# Patient Record
Sex: Female | Born: 1935 | Race: Black or African American | Hispanic: No | Smoking: Never smoker
Health system: Southern US, Community
[De-identification: ages and names within clinical notes are randomized; demographics above are authoritative.]

## PROBLEM LIST (undated history)

## (undated) DIAGNOSIS — F0280 Dementia in other diseases classified elsewhere without behavioral disturbance: Secondary | ICD-10-CM

## (undated) DIAGNOSIS — H409 Unspecified glaucoma: Secondary | ICD-10-CM

## (undated) DIAGNOSIS — M109 Gout, unspecified: Secondary | ICD-10-CM

## (undated) DIAGNOSIS — M810 Age-related osteoporosis without current pathological fracture: Secondary | ICD-10-CM

## (undated) DIAGNOSIS — R609 Edema, unspecified: Secondary | ICD-10-CM

## (undated) DIAGNOSIS — K219 Gastro-esophageal reflux disease without esophagitis: Secondary | ICD-10-CM

## (undated) DIAGNOSIS — E785 Hyperlipidemia, unspecified: Secondary | ICD-10-CM

## (undated) DIAGNOSIS — I10 Essential (primary) hypertension: Secondary | ICD-10-CM

## (undated) DIAGNOSIS — G309 Alzheimer's disease, unspecified: Secondary | ICD-10-CM

## (undated) DIAGNOSIS — E119 Type 2 diabetes mellitus without complications: Secondary | ICD-10-CM

## (undated) HISTORY — DX: Gout, unspecified: M10.9

## (undated) HISTORY — DX: Essential (primary) hypertension: I10

## (undated) HISTORY — DX: Age-related osteoporosis without current pathological fracture: M81.0

## (undated) HISTORY — DX: Edema, unspecified: R60.9

## (undated) HISTORY — DX: Dementia in other diseases classified elsewhere, unspecified severity, without behavioral disturbance, psychotic disturbance, mood disturbance, and anxiety: F02.80

## (undated) HISTORY — DX: Alzheimer's disease, unspecified: G30.9

## (undated) HISTORY — DX: Type 2 diabetes mellitus without complications: E11.9

## (undated) HISTORY — DX: Hyperlipidemia, unspecified: E78.5

---

## 2005-02-10 ENCOUNTER — Inpatient Hospital Stay (HOSPITAL_COMMUNITY): Admission: EM | Admit: 2005-02-10 | Discharge: 2005-02-18 | Payer: Self-pay | Admitting: Emergency Medicine

## 2005-02-28 ENCOUNTER — Emergency Department (HOSPITAL_COMMUNITY): Admission: EM | Admit: 2005-02-28 | Discharge: 2005-03-01 | Payer: Self-pay | Admitting: Emergency Medicine

## 2005-03-06 ENCOUNTER — Emergency Department (HOSPITAL_COMMUNITY): Admission: EM | Admit: 2005-03-06 | Discharge: 2005-03-06 | Payer: Self-pay | Admitting: Emergency Medicine

## 2005-03-08 ENCOUNTER — Inpatient Hospital Stay (HOSPITAL_COMMUNITY): Admission: EM | Admit: 2005-03-08 | Discharge: 2005-03-31 | Payer: Self-pay | Admitting: Emergency Medicine

## 2006-02-06 IMAGING — CR DG CHEST 2V
3 series · 3 of 3 positions shown · non-contrast
Comparison: 03/08/05.
COMPARISON: None.

CLINICAL DATA: Hypotension, diarrhea, abdominal and pelvic pain, dyspnea. 
 2-VIEW CHEST:
TECHNIQUE: Multidetector CT imaging of the abdomen was performed following the standard protocol with oral contrast but without IV contrast secondary to elevated creatinine.
TECHNIQUE: Multidetector CT imaging of the pelvis was performed following the standard protocol with oral contrast and without IV contrast. 
 Diffuse colonic wall thickening noted throughout the entire colon to the rectum.  Mild subcutaneous and mesenteric edema noted.  Tiny amount of free fluid in the pelvis noted.  Foley catheter, gas, and thick walled bladder noted.  The appendix is not identified.  There is no evidence of bowel obstruction.   Patient is status-post hysterectomy.

[view not recorded (1 of 3)]
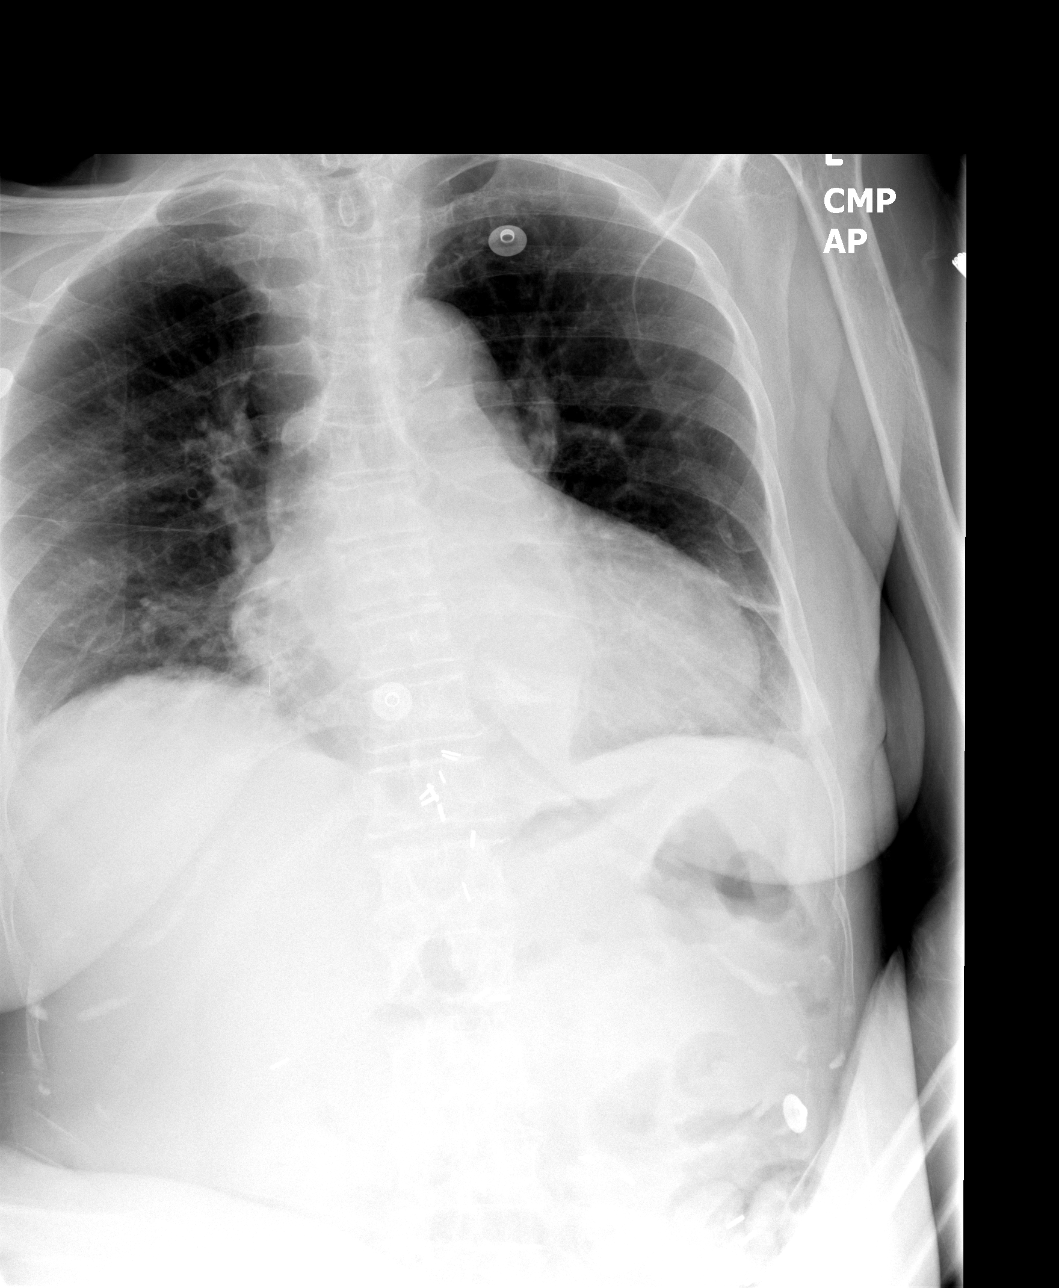

[view not recorded (2 of 3)]
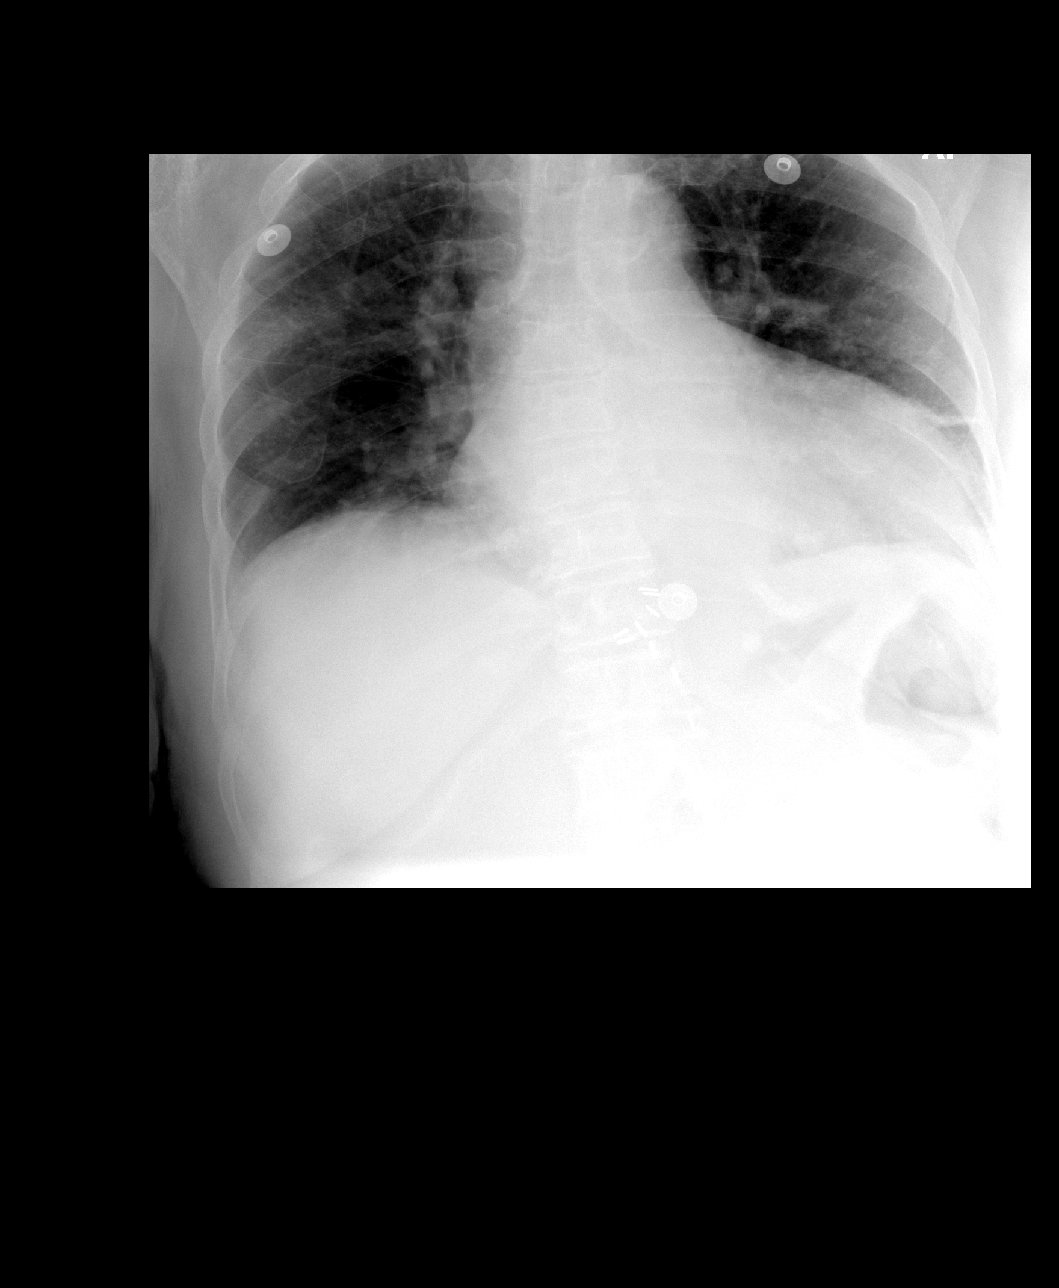

[view not recorded (3 of 3)]
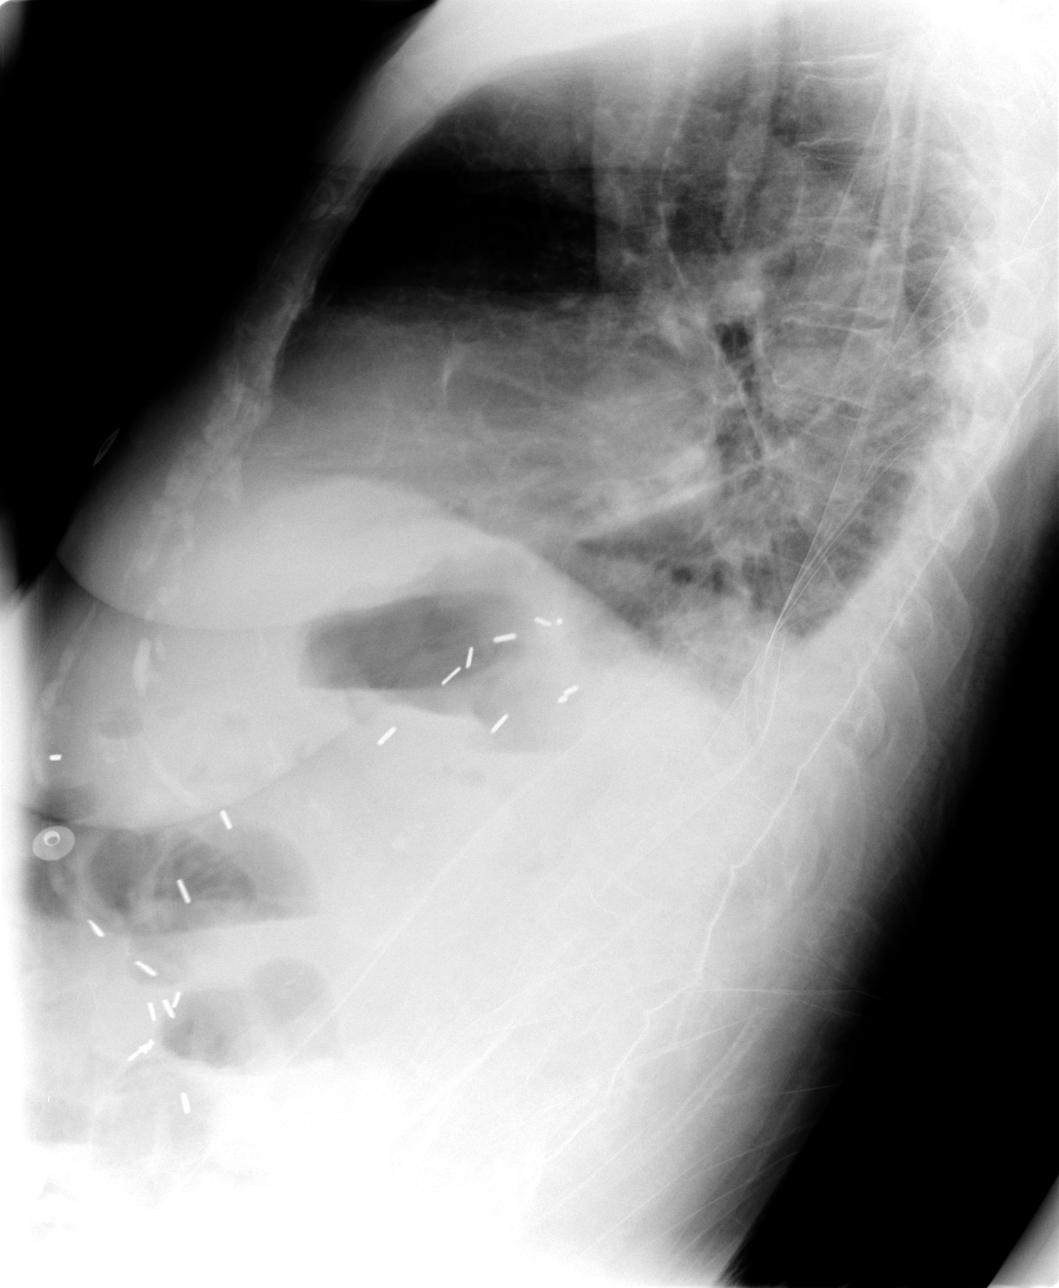

[3 of 3 positions shown; findings below may reference images not displayed]

Cardiomegaly, increasing pulmonary vascular congestion and small bilateral pleural effusions are noted.  Mild bibasilar atelectasis is identified.
IMPRESSION: Cardiomegaly with increasing vascular congestion and small effusions.  
 ABDOMEN CT WITHOUT CONTRAST:
Small bilateral pleural effusions and bibasilar atelectasis identified.  Cardiomegaly is present.  Surgical clips in the upper abdomen are present.  The liver, spleen, adrenal glands, visualized portions of the pancreas are unremarkable.  Mild renal atrophy and probable bilateral renal cysts are noted.  Probable small gallstone is noted but the remainder of the gallbladder is unremarkable.  There is wall thickening of the entire colon with mild adjacent inflammation compatible with colitis.  There is thickening of the terminal ileum at the ileocecal valve also noted.  The appendix is not identified.  Please note that parenchymal abnormalities within the solid viscera may be missed as intravenous contrast was not administered.  No evidence of abdominal aortic aneurysm or definite biliary dilatation.
IMPRESSION: 1.  Pancolitis probably infectious or inflammatory.  
 2.  Small bilateral pleural effusions, bibasilar atelectasis, and cardiomegaly. 
 3.  Mildly atrophic kidneys.  
 PELVIS CT WITHOUT CONTRAST:
IMPRESSION: 1.  Pancolitis likely infectious or inflammatory.
 2.  Tiny amount of free fluid.

## 2006-02-23 IMAGING — CR DG CHEST 1V PORT
1 series · 1 of 1 positions shown · non-contrast
Comparison: none

CLINICAL DATA: Hypotension

Portable chest at 0344:
Comparison 03/23/2005. New pleural effusions, right greater than left. Worsening
interstitial edema or infiltrate, throughout the right lung and at the left lung
base. Mild enlargement of cardiac silhouette stable. Vascular clips near the GE
junction.

[view not recorded]
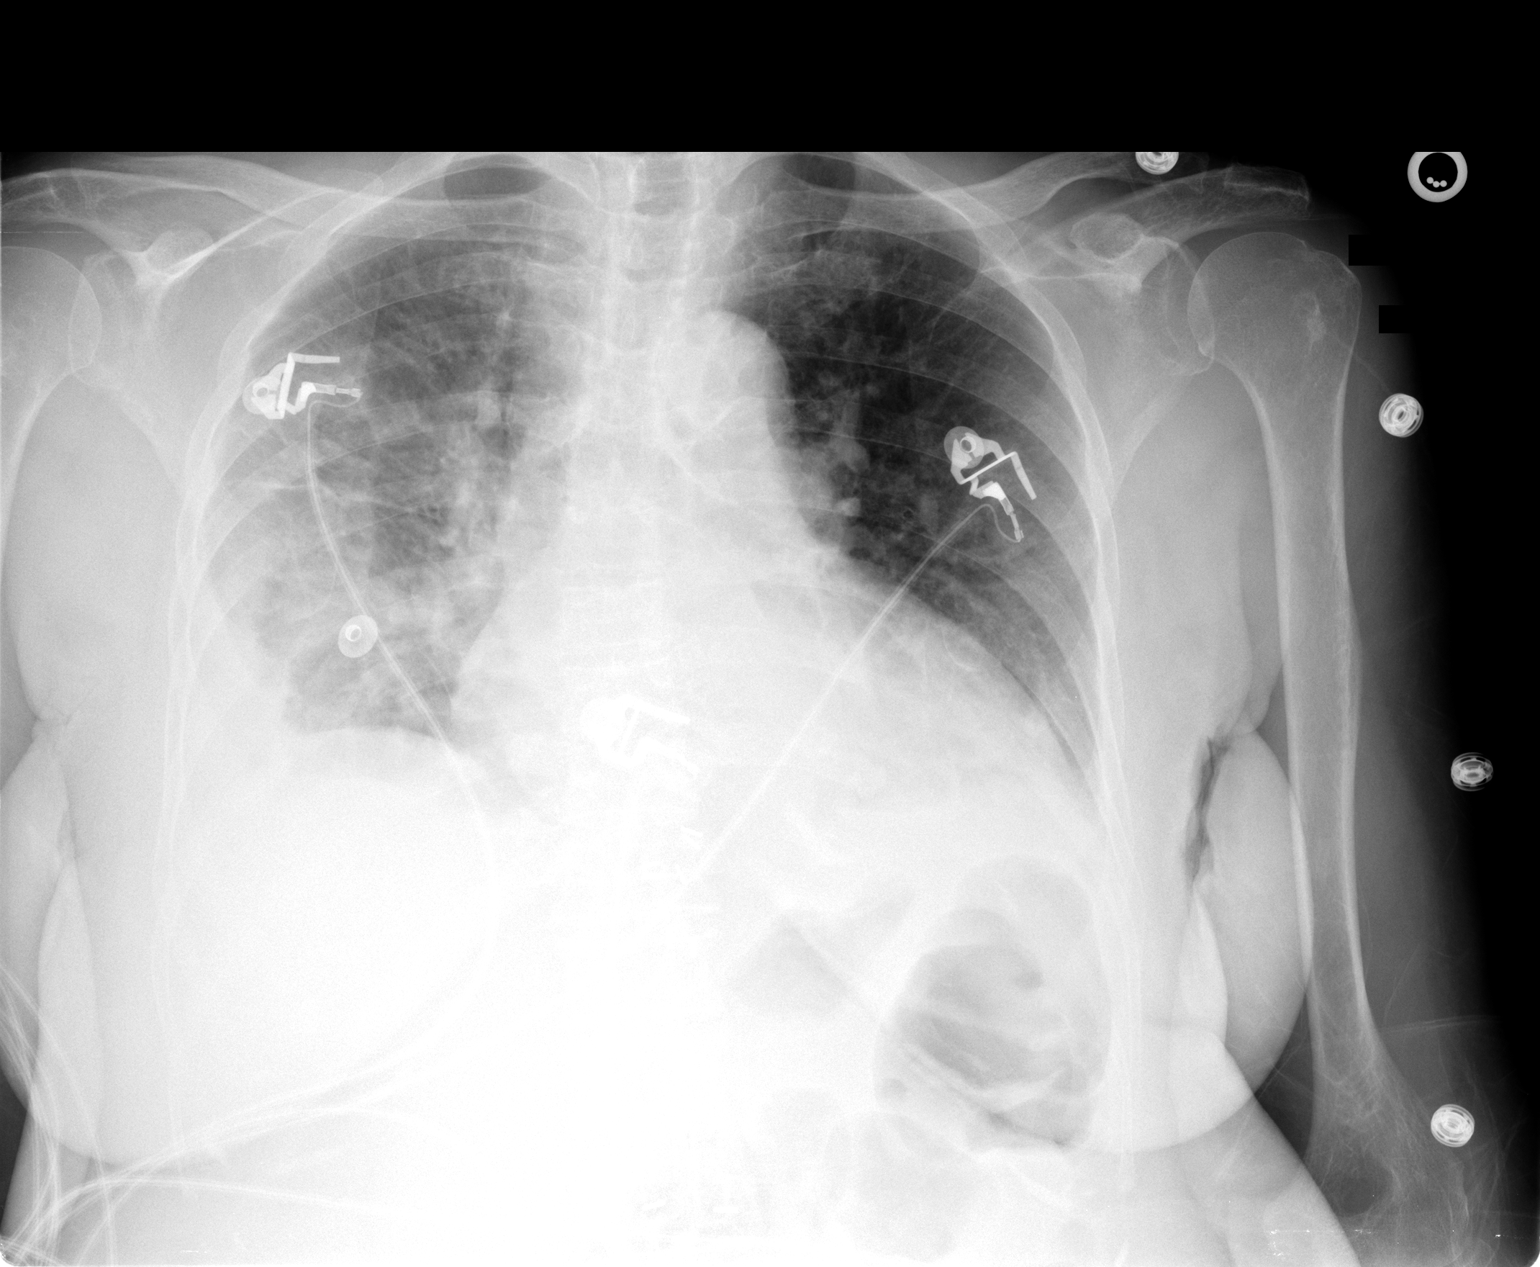

[1 of 1 positions shown; findings below may reference images not displayed]

IMPRESSION: 1. Worsening asymmetric edema or infiltrates effusions, right greater than left.
2. Stable cardiomegaly

## 2006-02-27 IMAGING — CR DG CHEST 2V
2 series · 2 of 2 positions shown · non-contrast
Comparison: 03/29/05.

CLINICAL DATA: Hypertension.  Diarrhea.  Weakness.
 CHEST - 2 VIEW:

[w chest lat]
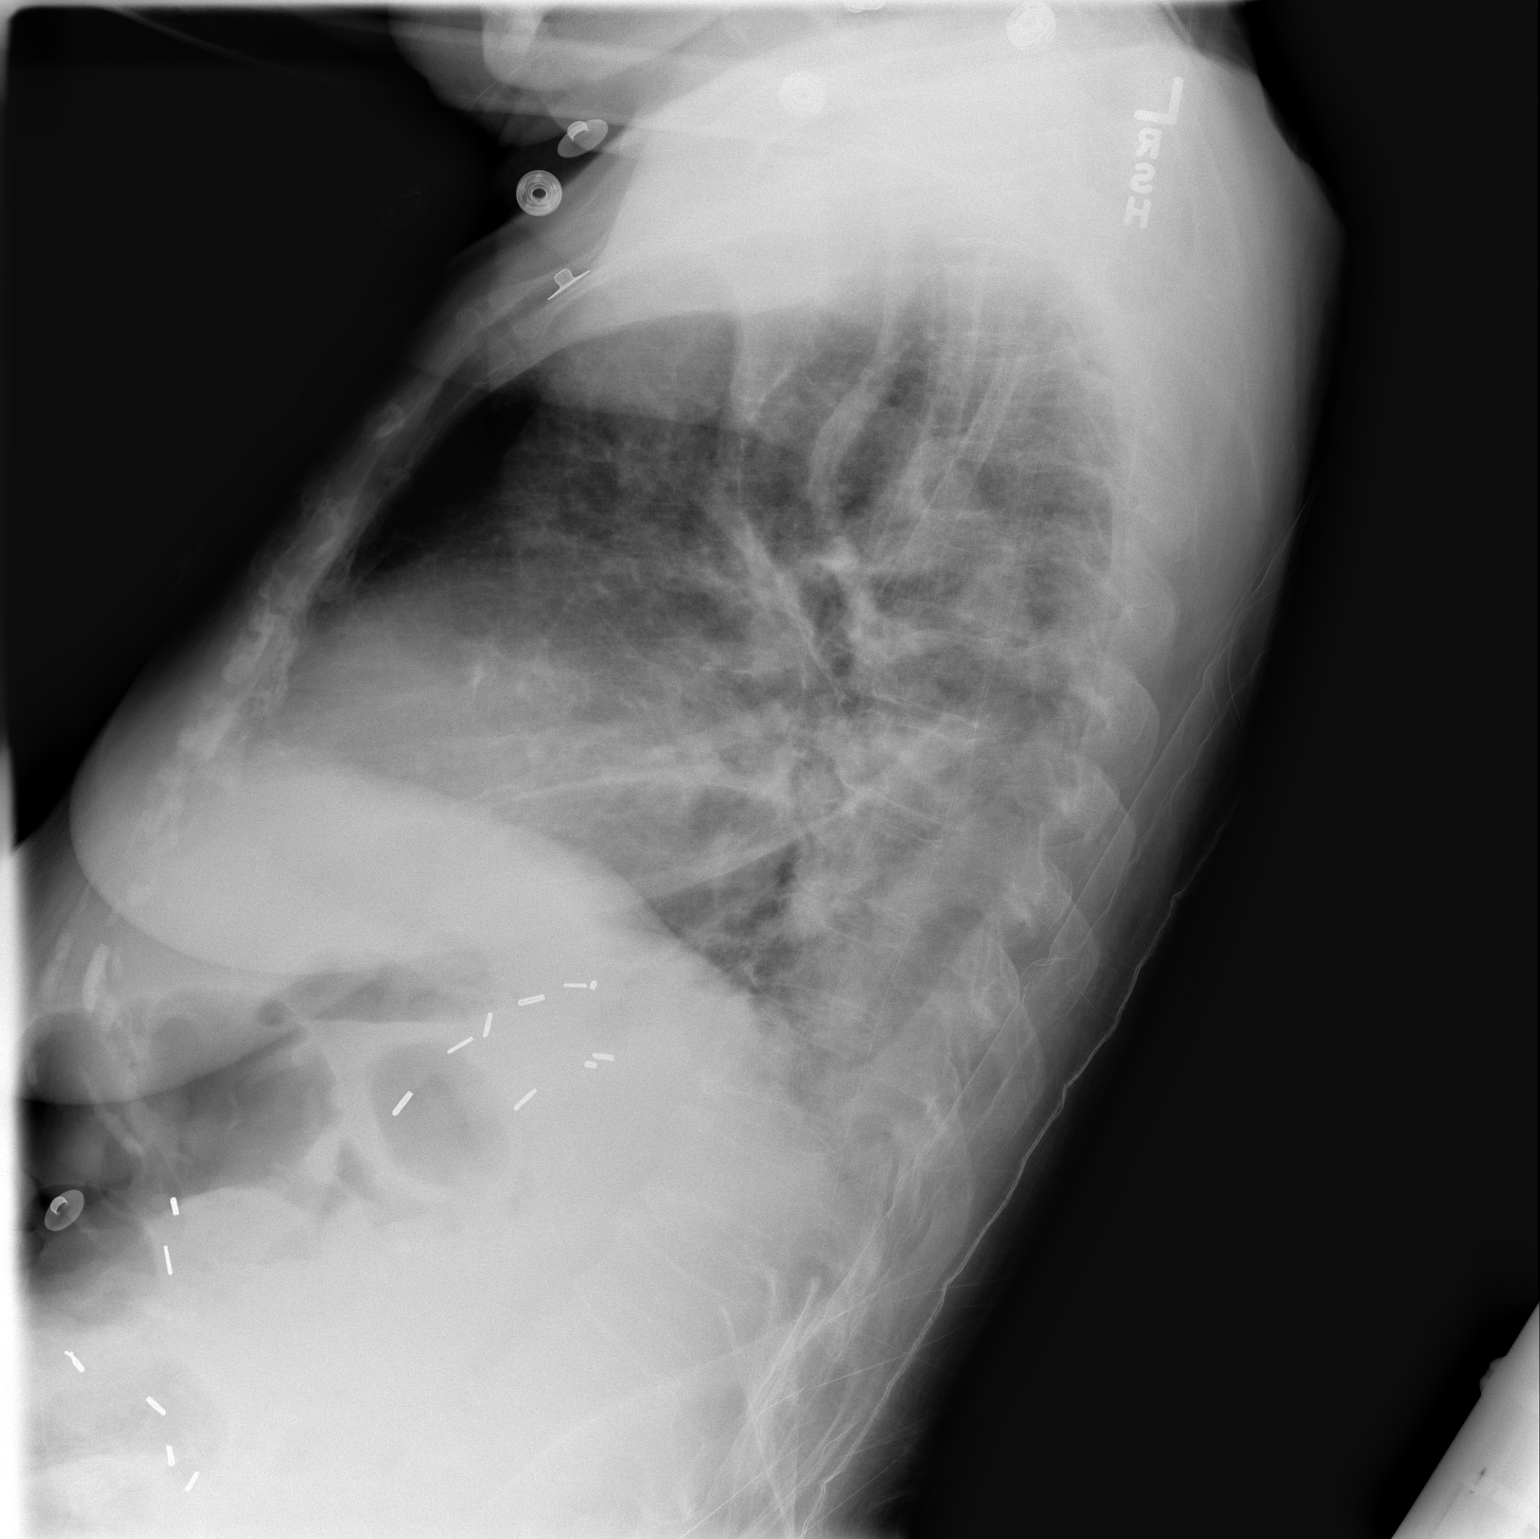

[w chest ap]
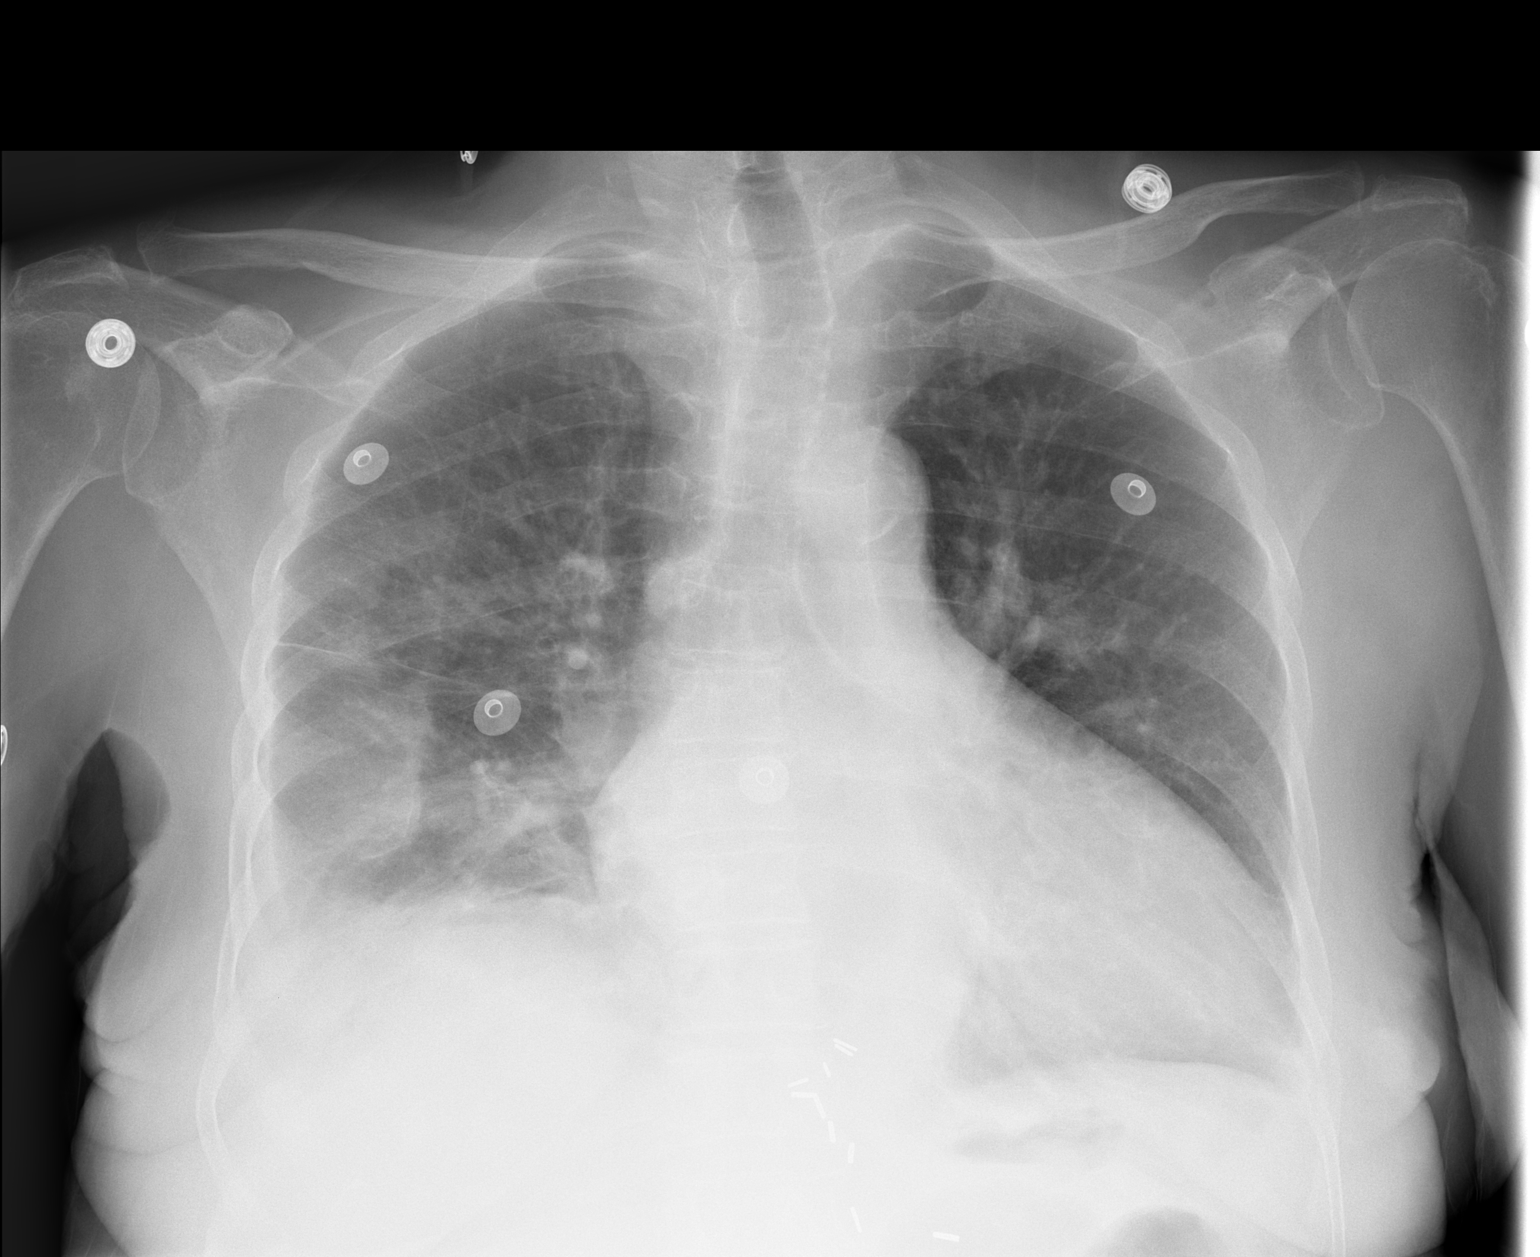

[2 of 2 positions shown; findings below may reference images not displayed]

FINDINGS: Cardiac enlargement is unchanged.  
 There is a right pleural effusion with increased opacity at the right lung base which may represent fluid tracking along the fissure. This is unchanged in appearance when compared to the prior examination.  Mild increased hazy opacity throughout the right lung may represent asymmetric pulmonary edema.  
 Left lung is clear.
IMPRESSION: 1.  No significant interval change in the appearance of the right lung. 
 2.  Stable cardiac enlargement.

## 2010-06-27 ENCOUNTER — Emergency Department (HOSPITAL_COMMUNITY)
Admission: EM | Admit: 2010-06-27 | Discharge: 2010-06-28 | Payer: Self-pay | Source: Home / Self Care | Admitting: Emergency Medicine

## 2010-12-04 NOTE — Consult Note (Signed)
[name], [name]             ACCOUNT NO.:  [id number]   MEDICAL RECORD NO.:  [id number]          PATIENT TYPE:  INP   LOCATION:  6742                         FACILITY:  [facility]   PHYSICIAN:  Deanna Artis. Hickling, M.D.DATE OF BIRTH:  Jan 29, 1913   DATE OF CONSULTATION:  03/11/2005  DATE OF DISCHARGE:                                   CONSULTATION   CHIEF COMPLAINT:  Altered mental status.   HISTORY OF THE PRESENT CONDITION:  Vickie [name] is a 77 year old woman  admitted with lethargy, hypotension, leg edema, abdominal discomfort,  confusion, and hypotension.   The patient was dehydrated.  She has been in the hospital since March 08, 2005.  She has had CT of her abdomen and pelvis which showed evidence of a  pancolitis on the basis of thickening of the wall of her colon.  GI consult  saw the patient and was unable to proceed with a colonoscopy because of her  somnolence.  I was asked to see her to provide an opinion as to the etiology  of her dysfunction.   REVIEW OF SYSTEMS:  Remarkable for a history of hypertension, previous mini  strokes, history of falling, bruising her right knee four days ago, and  falling in the bathroom two weeks ago, anemia, diarrhea, history of urinary  tract infection with urinary incontinence.   PAST MEDICAL/SURGICAL HISTORY:  1.  Left knee surgery 2-1/2 years ago.  2.  She also has problems with lower extremity edema.  3.  She also has had gastroesophageal reflux disease.  4.  Osteoporosis.  5.  Hypertension.  6.  Dyslipidemia.   During this hospitalization, she has had prerenal azotemia, with BUN 50,  creatinine 4.5, hyponatremia, sodium of 130, leukocytosis, with white cell  counts over 21,000, with moderate left shift, gastroparesis.   CURRENT MEDICATIONS:  1.  Protonix 40 mg daily.  2.  Subcutaneous heparin 5,000 units q.8 h.  3.  Allopurinol 100 mg daily (elevated uric acid).  4.  Albumin 12.5 g twice daily for hypoalbuminemia due  to malnutrition.  5.  She is not on an central nervous system-altering medications.   The patient has been incontinent of bladder, continent of bowel.  She  requires extensive assistance to ambulate, to transfer, to dress, to bathe,  and to toilet, and limited assistance with eating in the past.   MEDICATIONS PRIOR TO ADMISSION:  1.  Ciprofloxacin 250 mg twice daily.  2.  Prevacid 30 mg daily.  3.  Vytorin 10/20 daily.  4.  Clinoril 200 mg daily.  5.  Aricept 10 mg daily.  6.  Lisinopril 20 mg daily.  7.  Tylenol 325 mg q.6 h. as needed.   DRUG ALLERGIES:  None known.   EXAMINATION:  VITAL SIGNS:  Blood pressure 136/62; resting pulse 86;  temperature 100.9; respirations 20; oxygen saturation 96% on room air;  weight 135.2 pounds.  ENT:  Decreased range of motion of her neck, with spondylosis.  No  infections, no bruits.  LUNGS:  Clear.  HEART:  No murmurs.  PULSE:  Normal.  ABDOMEN:  Soft, but slightly  tender.  Bowel sounds normal.  No  hepatosplenomegaly.  EXTREMITIES:  Normal, except for surgical scars.  Slight edema distally.  NEUROLOGIC:  The patient is disoriented to all but her name, and that she is  in the hospital.  Her memory is poor.  She is able to name, repeat, and  follow commands (two step).  She is unable to write or read.  Cranial  nerves:  Pupils are 2 mm and reactive.  She has bilateral cataracts.  I  cannot see her fundi.  Visual fields are full to counting fingers.  Extraocular movements are full.  Symmetric facial strength.  Midline tongue.  She turns to localized sound.  Motor examination:  Normal strength in her  upper extremities.  Poor effort distally in her lower extremities.  She  wiggles and opposes her fingers.  She can wiggle her toes.  Sensation:  Withdrawal x4.  Cerebellar:  Finger-to-nose and rapid repetitive movements  are slow, but no dystaxia.  She does not have cogwheeling.  Gait cannot be  tested.  She is areflexic.  She had bilateral  flexor plantar responses.   IMPRESSION:  1.  Dementia by history, senile dementia of the Alzheimer's type, 331.0.  2.  Superimposed toxic metabolic delirium, 293.0.   I see no signs of focal neurologic deficits, despite the history of previous  mini strokes.  She has fever, leukocytosis which is improving, no signs of  urinary tract infection on the basis of urinalysis or cultures, negative  blood cultures at this time.  She has a mild metabolic acidosis.  She has  pancolitis.  She has severe malnutrition, with an albumin of 7.1, prerenal  azotemia, which is improving, and dysphagia.   RECOMMENDATIONS:  I agree with her medical management at this time.  The  patient seems to play possum.  Her eyes were wide open when I initially  saw her.  She tended to keep them closed, and I had to cajole her to get  anything done.  Though she responded slowly, she responded accurately to  most things.  As I left, she opened her eyes to the aide and gave a big  smile.  At this time, I see little to be gained from an extensive nervous  system workup including MRI scan and EEG with a nonlateralized examination.  I would have speech therapy perform a modified barium swallow.  The patient  should be on a D2 nectar-thick diet at this time.  I observed her having  some choking just on her saliva.  We probably should take her off of all  oral medications at this point and switch to IV medicines.  If she can  become more awake, then she needs to have a swallowing study.  Ultimately,  her long-term therapy goals need to be discussed with her family members as  regards Panda tube or feeding gastrostomy.  I would  consider the use of restarting her Aricept when her medical problems improve  and she can swallow.  We will check vitamin B-12, red blood cell folic acid,  VDRL, and TSH.   I appreciate the opportunity to participate in her care.      Deanna Artis. Sharene Skeans, M.D. Electronically Signed      [facility]/MEDQ  D:  03/11/2005  T:  03/12/2005  Job:  [id number]   cc:   Jarome Matin, M.D.  [address] [place]  [place] [address]  Fax: [phone]

## 2010-12-04 NOTE — Discharge Summary (Signed)
NAMEAMBER, WILLIARD NO.:  [id number]   MEDICAL RECORD NO.:  [id number]          PATIENT TYPE:  INP   LOCATION:  6742                         FACILITY:  [facility]   PHYSICIAN:  Jarome Matin, M.D.DATE OF BIRTH:  02-Dec-1912   DATE OF ADMISSION:  03/08/2005  DATE OF DISCHARGE:  03/31/2005                                 DISCHARGE SUMMARY   HISTORY OF PRESENT ILLNESS:  Vickie [name] is a 77 year old.  She was living  in an assisted living quarters at [facility] and she apparently had  developed a urinary tract infection and she had been put on Cipro.  She also  had had some diarrhea prior to that in the unit.  She had lost a lot of  fluid through the diarrhea and she had stopped eating, had become very weak  and became weaker and weaker over the last several days before admission.  Her blood pressure had dropped quite a bit and she had developed quite bit  of bilateral leg edema.  The patient is wheel-chair-bound and on admission,  apparently she had a blood pressure of about 70 systolic with a fairly rapid  heart beat to about 120, and the patient was not feeling well at all.  She  was somewhat confused and was admitted.   PHYSICAL EXAMINATION:  GENERAL:  She was arousable, but very lethargic and  she could not stand.  VITAL SIGNS:  As stated, her pulse was 62, respirations 18, and blood sugar  was 155 and her blood pressure was 80/40.  HEENT:  She was edentulous and had no teeth.  CHEST:  Chest was fairly clear.   HOSPITAL COURSE:  When the patient was admitted, she was very lethargic,  very edematous and confused.  I had had her in previous and she had been  discharged; she was alert and eating.  However, on this admission, she was  not eating at all and on this admission also, the pertinent labs, her white  count was up to 22,000 and when she was admitted before, her creatinine and  BUN were normal; her BUN was 51, creatinine was 4.5 on this  admission.  Earlier in the month, her BUN was 10 and creatinine was 1, so she has had  some dehydration and decrease in renal function, and she apparently has been  having a lot of diarrhea.  Uric acid was very high, about 14, and her  albumin was very low, down to 1.7.  We did ask GI to see the patient;  however, during her workup, because of the diarrhea and she had some blood  in her stool, I wanted to get a colonoscopy, but she was so weak that GI was  a little hesitant about doing a colonoscopy on her.  Also in her history,  she had some left knee surgery 2-1/2 years ago and she does have a history  of reflux esophagitis and reflux gastroparesis.  I also asked Neurology to  see her because of her mental status changes.  We did do a CT of her abdomen  and pelvis which showed pancolitis  and thickening of the wall of her colon.  She has had some mini-strokes in the past and a hypertensive history and  some falling.  On her stool, we did cultures and it came back positive for  C. difficile and so I spoke with GI and she decided we would go ahead and  treat her with Flagyl and she was not going to do the colonoscopy at this  point and wanted to see how she responded to treatment; she was put on  Flagyl 500 mg three times a day.  However, the patient was not eating at  all.  She had some elevations in the gamma area of her protein  electrophoresis; it seems like her proteins were a little bit elevated.  We  did a protein electrophoresis which showed a nonspecific pattern and so we  decided she did not have a monoclonal spike or anything of that nature.  We  continued to treat her.  She also developed a urinary tract infection which  was treated with Cipro.  She had some evidence early on of congestion and we  decided to go ahead and put her on some Lasix and to start her on BiDil one  tablet 3 times a day.  There was good evidence that she would likely respond  well to isosorbide dinitrate  and hydralazine; apparently there is some  increase in nitrous oxide on the myocardium in these situations.  The  patient's appetite was very poor, so we also started her on Megace 40 mg 3  times a day and then gradually over time, she started to eat, she started to  feel better, she was not complaining of shortness of breath and eventually  she was able to sit up in the chart.  Her white count, which had been up  very high on admission, 22,000, had come down to normal; her last white  count done on the 11th was 5.9 with a hemoglobin of 8.9 and hematocrit of  26.4; we gave her 2 units of packed cells.  Her BUN came down.  She had a  uric acid of 14 and we put her on allopurinol and fluids, and her BUN came  down to 9 with a creatinine of 1.0.  Albumin is still a little bit low at  2.6.  On the Megace, the patient's appetite began to develop, which improved  and she was eating most of her 3 meals a day and her labs began to improve  and normalize considerably.  We do not think she had a heart attack; she may  have had some high output failure.  Today, after being some time in the  hospital, she is sitting up in bed, she has much better labs, she is eating  quite well and has a good appetite.   DISCHARGE MEDICATIONS:  1.  We are going to continue her on Megace 40 mg 3 times a day.  2.  We are also going to continue her on allopurinol or Zyloprim 100 mg      every other day.  3.  We are going to discontinue the Flagyl for the C. difficile; she has      been on it over 2 weeks and we have had negative C. difficiles.  4.  Her Protonix -- we will continue that -- p.o. 40 mg daily.  5.  Aricept -- I had put her on 5 mg and we will up it to 10 mg daily.  6.  Also, we are going to continue some Lasix 20 mg daily p.o. and on that,      we will probably keep her on the potassium chloride 10 mEq twice a day      p.o.  7.  Catapres 0.1 mg p.o. b.i.d. 8.  As I said, we are going to continue on the  BiDil one tablet 3 times a      day p.o.  9.  We also put her on Lovenox because she does not move very well and I      think we should keep her on low-dose heparin and she can get that at the      nursing home, Lovenox 30 mg injection once daily.  10. I would like to give her some multivitamins; we can use Vicon Forte; she      can take one of those daily p.o.   DISCHARGE INSTRUCTIONS:  The patient had a PPD skin test put on and was not  put on until this morning, March 31, 2005 at 1 a.m. and needs to be read  in 48 hours and that will be Saturday, April 02, 2005 at 1 a.m., or  thereabouts.   DIETARY SUPPLEMENTATION:  She was given some Ensure with Fiber, vanilla it  seemed she liked, and she was getting that, a can twice a day as she wants  it; she seems to be taking it pretty well right now.   PAIN MANAGEMENT:  We also lucked out and gave her a couple of Tylenol for  pain as needed; she has not been complaining of very much pain lately.   ACTIVITY:  The patient is starting to do fairly well on physical therapy and  we would like to continue physical therapy at the nursing home and also OT  can address the possibility that she might benefit from OT and that would be  appreciated.           ______________________________  Jarome Matin, M.D.     CEF/MEDQ  D:  03/31/2005  T:  03/31/2005  Job:  [id number]

## 2010-12-04 NOTE — Discharge Summary (Signed)
Vickie, [name] NO.:  [id number]   MEDICAL RECORD NO.:  [id number]          PATIENT TYPE:  INP   LOCATION:  5735                         FACILITY:  [facility]   PHYSICIAN:  Jarome Matin, M.D.DATE OF BIRTH:  17-Oct-1912   DATE OF ADMISSION:  02/10/2005  DATE OF DISCHARGE:  02/18/2005                                 DISCHARGE SUMMARY   ADMISSION DIAGNOSIS:  Severe urinary tract infection with sepsis.   DISCHARGE DIAGNOSES:  1.  Resolving urinary tract infection and sepsis.  2.  Hypertension.  3.  Probable borderline diabetes.  4.  Gastroparesis.  5.  Arthritis.  6.  Early dementia that seems to be fairly well controlled with Aricept.   BRIEF HISTORY AND PHYSICAL/HOSPITAL COURSE:  Vickie [name] is a 77 year old  lovely lady who was brought to the emergency room from [facility] with  fever, lethargy, some mental status changes, and weakness. She was also  febrile. She had a temperature of 102.8, respirations 20, blood pressure  136/106. Chest was clear. She was very lethargic and she was admitted,  cultured, and cultures grew out E. coli (urine and blood). Her urine had  greater than 100,000 colonies sensitive to Cipro and she was started on IV  Cipro and then subsequently p.o. Cipro 250 mg t.i.d. Initial labs showed the  urine was nitrite positive, with many bacteria. Sodium was 136, potassium  3.8, glucose 175, BUN 28, creatinine 1.5. Glycosylated hemoglobin or  hemoglobin A1C of 6.6 __________ 7. The patient was not on diabetic  medications, but she thought that she might be borderline diabetic. White  count 4.9, RBC 3.1, hemoglobin 10.4, hematocrit 31.0.  Protein was a little  bit low at 2.2, but gradually came up some as her nutrition improved. The  patient was given fluids and treatment. Gradually she became afebrile, her  mental status seemed to clear, and she slowly began to feel better. She was  able to start eating and drinking and her  status cleared considerably. She  was able to talk and use the bedside commode on her own. We subsequently  took out the Foley. She continued to take some of her antibiotics for  another two days after she gets out. Cipro 250 mg p.o. b.i.d. We will  continue her other medications of Prevacid 30 mg daily, lisinopril 20 mg  daily, Vytorin 10/20 one p.o. daily, Clinoril 200 mg b.i.d., Aricept 10 mg  daily as well as some other medications that she was taking at the  [facility] unit that she can continue when she goes back. She will  take Lortab 2.5/500 p.r.n. for pain and that is after she has tried Tylenol.  I would like for the patient to continue with PT at the home on a regular  basis to help with walking and activities of daily living, keeping her  muscles from wasting, etc., keep her moving. We would like to put her on a  modified carbohydrate diet. I think she is a borderline diabetic and may not  need any medications at this point. We will just modify the carbohydrates in  her diet. Then we can see her in the office in two or three weeks after  discharge, bringing in a list of her medications and so forth.       CEF/MEDQ  D:  02/18/2005  T:  02/18/2005  Job:  [id number]

## 2012-11-14 ENCOUNTER — Non-Acute Institutional Stay (SKILLED_NURSING_FACILITY): Payer: Medicare Other | Admitting: Adult Health

## 2012-11-14 ENCOUNTER — Encounter: Payer: Self-pay | Admitting: Adult Health

## 2012-11-14 DIAGNOSIS — I10 Essential (primary) hypertension: Secondary | ICD-10-CM

## 2012-11-14 DIAGNOSIS — R609 Edema, unspecified: Secondary | ICD-10-CM

## 2012-11-14 DIAGNOSIS — M109 Gout, unspecified: Secondary | ICD-10-CM

## 2012-11-14 DIAGNOSIS — H409 Unspecified glaucoma: Secondary | ICD-10-CM

## 2012-11-14 DIAGNOSIS — F0280 Dementia in other diseases classified elsewhere without behavioral disturbance: Secondary | ICD-10-CM

## 2013-01-02 ENCOUNTER — Non-Acute Institutional Stay (SKILLED_NURSING_FACILITY): Payer: Medicare Other | Admitting: Adult Health

## 2013-01-02 DIAGNOSIS — R609 Edema, unspecified: Secondary | ICD-10-CM

## 2013-01-02 DIAGNOSIS — G309 Alzheimer's disease, unspecified: Secondary | ICD-10-CM

## 2013-01-02 DIAGNOSIS — F0280 Dementia in other diseases classified elsewhere without behavioral disturbance: Secondary | ICD-10-CM

## 2013-01-02 DIAGNOSIS — H409 Unspecified glaucoma: Secondary | ICD-10-CM

## 2013-01-02 DIAGNOSIS — M109 Gout, unspecified: Secondary | ICD-10-CM

## 2013-01-02 DIAGNOSIS — I10 Essential (primary) hypertension: Secondary | ICD-10-CM

## 2013-03-14 ENCOUNTER — Non-Acute Institutional Stay (SKILLED_NURSING_FACILITY): Payer: Medicare Other | Admitting: Internal Medicine

## 2013-03-14 DIAGNOSIS — M810 Age-related osteoporosis without current pathological fracture: Secondary | ICD-10-CM

## 2013-03-14 DIAGNOSIS — E119 Type 2 diabetes mellitus without complications: Secondary | ICD-10-CM

## 2013-03-14 DIAGNOSIS — F0280 Dementia in other diseases classified elsewhere without behavioral disturbance: Secondary | ICD-10-CM

## 2013-03-14 DIAGNOSIS — E785 Hyperlipidemia, unspecified: Secondary | ICD-10-CM

## 2013-03-14 DIAGNOSIS — R609 Edema, unspecified: Secondary | ICD-10-CM

## 2013-03-14 DIAGNOSIS — M109 Gout, unspecified: Secondary | ICD-10-CM

## 2013-03-14 DIAGNOSIS — I10 Essential (primary) hypertension: Secondary | ICD-10-CM

## 2013-03-14 NOTE — Progress Notes (Signed)
Patient ID: Vickie [name], female   DOB: 03/09/1913, 79 y.o.   MRN: [id number] Location:  Location:  [name] Provider:  [name]. Renato Gails, D.O., C.M.D.  Code Status:  DNR   Chief Complaint  Patient presents with  . Medical Managment of Chronic Issues  . Acute Visit    dry eyes despite eye drops    HPI: 79 yo black female long term care resident with h/o Alzheimer's disease, gout, senile osteoporosis was seen for medical mgt of her chronic diseases.  She has c/o dry eyes to nursing staff, but tells me that the drops they are giving her work just fine.  She had no complaints and was completing a word search in her room.     Review of Systems:  Review of Systems  Constitutional: Negative for fever and chills.  HENT: Negative for congestion.   Eyes: Negative for pain, discharge and redness.  Respiratory: Negative for shortness of breath.   Cardiovascular: Negative for chest pain.  Gastrointestinal: Negative for abdominal pain, diarrhea, constipation, blood in stool and melena.  Genitourinary: Negative for dysuria.  Musculoskeletal: Negative for myalgias and falls.  Skin: Negative for rash.  Neurological: Negative for dizziness and headaches.  Endo/Heme/Allergies: Does not bruise/bleed easily.    Medications: Patient's Medications  New Prescriptions   No medications on file  Previous Medications   ALLOPURINOL (ZYLOPRIM) 100 MG TABLET    Take 100 mg by mouth daily.   BIMATOPROST (LUMIGAN) 0.03 % OPHTHALMIC SOLUTION    Place 1 drop into both eyes at bedtime.   CALCITONIN, SALMON, (MIACALCIN/FORTICAL) 200 UNIT/ACT NASAL SPRAY    Place 1 spray into the nose daily.   CALCIUM-VITAMIN D (OSCAL WITH D) 500-200 MG-UNIT PER TABLET    Take 1 tablet by mouth 3 (three) times daily.   CARBOXYMETHYLCELLULOSE (REFRESH PLUS) 0.5 % SOLN    Place 1 drop into both eyes 4 (four) times daily.   DONEPEZIL (ARICEPT) 10 MG TABLET    Take 10 mg by mouth at bedtime.   FUROSEMIDE (LASIX) 40  MG TABLET    Take 40 mg by mouth daily.   MEMANTINE HCL ER (NAMENDA XR) 28 MG CP24    Take 28 mg by mouth daily.   MULTIPLE VITAMINS-IRON (MULTIVITAMINS WITH IRON) TABS TABLET    Take 1 tablet by mouth daily.   POTASSIUM CHLORIDE SA (K-DUR,KLOR-CON) 20 MEQ TABLET    Take 20 mEq by mouth daily.  Modified Medications   No medications on file  Discontinued Medications   No medications on file    Physical Exam: Filed Vitals:   03/14/13 1530  BP: 138/72  Pulse: 88  Temp: 98.6 F (37 C)  Resp: 20  SpO2: 97%   Physical Exam  Constitutional: She appears well-developed and well-nourished. No distress.  Seated in her wheelchair  HENT:  Head: Normocephalic and atraumatic.  Eyes: Conjunctivae and EOM are normal. Pupils are equal, round, and reactive to light. Right eye exhibits no discharge. Left eye exhibits no discharge. No scleral icterus.  Wears glasses for reading  Neck: Normal range of motion. Neck supple. No JVD present.  Cardiovascular: Normal rate, regular rhythm, normal heart sounds and intact distal pulses.   Pulmonary/Chest: Effort normal and breath sounds normal. No respiratory distress.  Abdominal: Soft. Bowel sounds are normal. She exhibits no distension and no mass. There is no tenderness.  Musculoskeletal: Normal range of motion. She exhibits no edema and no tenderness.  Lymphadenopathy:    She has no  cervical adenopathy.  Neurological: She is alert.  Oriented to person and place, not time  Skin: Skin is warm and dry.  Psychiatric: She has a normal mood and affect.   Labs reviewed: No recent (12/13)  Assessment/Plan 1. Diabetes mellitus type II, controlled -controlled with diet alone -check hba1c 2. Hyperlipidemia LDL goal < 100 -f/u flp 3. Gout -cont allopurinol, no recent flares 4. Alzheimer's disease -stable, staff have no concerns about her, not falling, attends activities and does word searches in her room, eating well -continue aricept and namenda--would  not stop due to her stability at this time 5. Hypertension, benign essential, goal below 140/90 -at goal with current therapy 6. Senile osteoporosis -on calcitonin for pain related to compression fractures -also on ca with D -f/u vitamin D level 7. Edema -no active edema with lasix and potassium -f/u cmp  Family/ staff Communication: discussed with nursing staff Goals of care: DNR Labs/tests ordered:  Cbc, cmp, flp, hba1c next draw

## 2013-03-19 ENCOUNTER — Encounter: Payer: Self-pay | Admitting: Internal Medicine

## 2013-03-19 DIAGNOSIS — I10 Essential (primary) hypertension: Secondary | ICD-10-CM | POA: Insufficient documentation

## 2013-03-19 DIAGNOSIS — M109 Gout, unspecified: Secondary | ICD-10-CM | POA: Insufficient documentation

## 2013-03-19 DIAGNOSIS — E785 Hyperlipidemia, unspecified: Secondary | ICD-10-CM | POA: Insufficient documentation

## 2013-03-19 DIAGNOSIS — F0280 Dementia in other diseases classified elsewhere without behavioral disturbance: Secondary | ICD-10-CM | POA: Insufficient documentation

## 2013-03-19 DIAGNOSIS — R609 Edema, unspecified: Secondary | ICD-10-CM | POA: Insufficient documentation

## 2013-03-19 DIAGNOSIS — M810 Age-related osteoporosis without current pathological fracture: Secondary | ICD-10-CM | POA: Insufficient documentation

## 2013-03-19 DIAGNOSIS — E1129 Type 2 diabetes mellitus with other diabetic kidney complication: Secondary | ICD-10-CM | POA: Insufficient documentation

## 2013-04-13 ENCOUNTER — Encounter: Payer: Self-pay | Admitting: Nurse Practitioner

## 2013-04-13 ENCOUNTER — Non-Acute Institutional Stay (SKILLED_NURSING_FACILITY): Payer: Medicare Other | Admitting: Nurse Practitioner

## 2013-04-13 DIAGNOSIS — M109 Gout, unspecified: Secondary | ICD-10-CM

## 2013-04-13 DIAGNOSIS — E119 Type 2 diabetes mellitus without complications: Secondary | ICD-10-CM

## 2013-04-13 DIAGNOSIS — I10 Essential (primary) hypertension: Secondary | ICD-10-CM

## 2013-04-13 DIAGNOSIS — R609 Edema, unspecified: Secondary | ICD-10-CM

## 2013-04-13 DIAGNOSIS — F0280 Dementia in other diseases classified elsewhere without behavioral disturbance: Secondary | ICD-10-CM

## 2013-04-13 NOTE — Progress Notes (Signed)
Patient ID: Vickie [name], female   DOB: 14-Nov-1912, 79 y.o.   MRN: [id number]   PCP: Bufford Spikes, DO   No Known Allergies  Chief Complaint  Patient presents with  . Medical Managment of Chronic Issues    HPI:  79 year old female who is a LTR of [facility] with PHM of DM, dementia, gout, edema, HTN is being seen today for routine follow up. Pt is unable to participate in ROS or HPI due to dementia; staff does not have any concerns at this time.   Review of Systems:  Unable to obtain  Past Medical History  Diagnosis Date  . Diabetes mellitus type II, controlled   . Hyperlipidemia LDL goal < 100   . Gout   . Alzheimer's disease   . Hypertension, benign essential, goal below 140/90   . Senile osteoporosis   . Edema    No past surgical history on file. Social History:   has no tobacco, alcohol, and drug history on file.  No family history on file.  Medications: Patient's Medications  New Prescriptions   No medications on file  Previous Medications   ALLOPURINOL (ZYLOPRIM) 100 MG TABLET    Take 100 mg by mouth daily.   BIMATOPROST (LUMIGAN) 0.03 % OPHTHALMIC SOLUTION    Place 1 drop into both eyes at bedtime.   CALCITONIN, SALMON, (MIACALCIN/FORTICAL) 200 UNIT/ACT NASAL SPRAY    Place 1 spray into the nose daily.   CALCIUM-VITAMIN D (OSCAL WITH D) 500-200 MG-UNIT PER TABLET    Take 1 tablet by mouth 3 (three) times daily.   CARBOXYMETHYLCELLULOSE (REFRESH PLUS) 0.5 % SOLN    Place 1 drop into both eyes 4 (four) times daily.   DONEPEZIL (ARICEPT) 10 MG TABLET    Take 10 mg by mouth at bedtime.   FUROSEMIDE (LASIX) 40 MG TABLET    Take 40 mg by mouth daily.   MEMANTINE HCL ER (NAMENDA XR) 28 MG CP24    Take 28 mg by mouth daily.   MULTIPLE VITAMINS-IRON (MULTIVITAMINS WITH IRON) TABS TABLET    Take 1 tablet by mouth daily.   POTASSIUM CHLORIDE SA (K-DUR,KLOR-CON) 20 MEQ TABLET    Take 20 mEq by mouth daily.  Modified Medications   No medications on file  Discontinued  Medications   No medications on file     Physical Exam:  Filed Vitals:   04/13/13 1734  BP: 137/73  Pulse: 71  Temp: 98 F (36.7 C)  Resp: 20  Weight: 147 lb (66.679 kg)   Physical Exam  Constitutional: She is well-developed, well-nourished, and in no distress. No distress.  HENT:  Head: Normocephalic and atraumatic.  Mouth/Throat: Oropharynx is clear and moist. No oropharyngeal exudate.  Eyes: Conjunctivae and EOM are normal. Pupils are equal, round, and reactive to light.  Neck: Normal range of motion. Neck supple.  Cardiovascular: Normal rate, regular rhythm and normal heart sounds.   Pulmonary/Chest: Effort normal and breath sounds normal.  Abdominal: Soft. Bowel sounds are normal. She exhibits no distension.  Musculoskeletal: She exhibits no edema and no tenderness.  Self propels in [facility]  Neurological: She is alert.  Skin: Skin is warm and dry. She is not diaphoretic.     Assessment/Plan 1. Hypertension, benign essential, goal below 140/90 Patient is stable; continue current regimen. Will monitor and make changes as necessary.  2. Diabetes mellitus type II, controlled Not currently on medications  3. Alzheimer's disease Stable in current setting  4. Gout Stable on allopurinol  5. Edema No noted edema; will dc potassium and lasix and recheck labs Staff to monitor for worsening edema while off medications

## 2013-05-11 ENCOUNTER — Non-Acute Institutional Stay (SKILLED_NURSING_FACILITY): Payer: Medicare Other | Admitting: Internal Medicine

## 2013-05-11 ENCOUNTER — Encounter: Payer: Self-pay | Admitting: Internal Medicine

## 2013-05-11 DIAGNOSIS — I10 Essential (primary) hypertension: Secondary | ICD-10-CM

## 2013-05-11 NOTE — Progress Notes (Signed)
MRN: [id number] Name: Vickie [name]  Sex: female Age: 79 y.o. DOB: 1913-07-19  PSC #: Ronni Rumble Facility/Room: 233A Level Of Care: SNF Provider: Merrilee Seashore D Emergency Contacts: Extended Emergency Contact Information Primary Emergency Contact: Smith,Roslyn Address: [address] [place], [place] [address] [place] Home Phone: [phone] Relation: Other  Code Status: DNR  Allergies: Review of patient's allergies indicates no known allergies.  Chief Complaint  Patient presents with  . Acute Visit    HPI: Patient is 80 y.o. female who the nurse asked me to see because of pedal edema , new for pt. Admits pt is not on BP meds.  Past Medical History  Diagnosis Date  . Diabetes mellitus type II, controlled   . Hyperlipidemia LDL goal < 100   . Gout   . Alzheimer's disease   . Hypertension, benign essential, goal below 140/90   . Senile osteoporosis   . Edema     History reviewed. No pertinent past surgical history.    Medication List       This list is accurate as of: 05/11/13  4:19 PM.  Always use your most recent med list.               allopurinol 100 MG tablet  Commonly known as:  ZYLOPRIM  Take 100 mg by mouth daily.     bimatoprost 0.03 % ophthalmic solution  Commonly known as:  LUMIGAN  Place 1 drop into both eyes at bedtime.     calcitonin (salmon) 200 UNIT/ACT nasal spray  Commonly known as:  MIACALCIN/FORTICAL  Place 1 spray into the nose daily.     calcium-vitamin D 500-200 MG-UNIT per tablet  Commonly known as:  OSCAL WITH D  Take 1 tablet by mouth 3 (three) times daily.     carboxymethylcellulose 0.5 % Soln  Commonly known as:  REFRESH PLUS  Place 1 drop into both eyes 4 (four) times daily.     donepezil 10 MG tablet  Commonly known as:  ARICEPT  Take 10 mg by mouth at bedtime.     multivitamins with iron Tabs tablet  Take 1 tablet by mouth daily.     NAMENDA XR 28 MG Cp24  Generic drug:   Memantine HCl ER  Take 28 mg by mouth daily.        No orders of the defined types were placed in this encounter.     There is no immunization history on file for this patient.  History  Substance Use Topics  . Smoking status: Unknown If Ever Smoked  . Smokeless tobacco: Not on file  . Alcohol Use: No    Review of Systems  DATA OBTAINED: from patient, nurse, GENERAL: Feels well no fevers, fatigue, appetite changes SKIN: No itching, rash HEENT: No complaint RESPIRATORY: No cough, wheezing, SOB CARDIAC: No chest pain, palpitations, + extremity edema  GI: No abdominal pain, No N/V/D or constipation, No heartburn or reflux  GU: No dysuria, frequency or urgency, or incontinence  MUSCULOSKELETAL: No unrelieved bone/joint pain NEUROLOGIC: No headache, dizziness or focal weakness PSYCHIATRIC: No overt anxiety or sadness. Sleeps well.   Filed Vitals:   05/11/13 1537  BP: 170/98  Pulse: 89  Temp: 98.5 F (36.9 C)  Resp: 20    Physical Exam  GENERAL APPEARANCE: Alert, conversant. Appropriately groomed. No acute distress  SKIN: No diaphoresis rash, or wounds HEENT: Unremarkable RESPIRATORY: Breathing is even, unlabored. Lung sounds  are clear   CARDIOVASCULAR: Heart RRR no murmurs, rubs or gallops. 1+ peripheral edema  GASTROINTESTINAL: Abdomen is soft, non-tender, not distended w/ normal bowel sounds.  GENITOURINARY: Bladder non tender, not distended  MUSCULOSKELETAL: No abnormal joints or musculature NEUROLOGIC: Cranial nerves 2-12 grossly intact. Moves all extremities no tremor. PSYCHIATRIC: Mood and affect appropriate to situation, no behavioral issues  Patient Active Problem List   Diagnosis Date Noted  . Diabetes mellitus type II, controlled   . Hyperlipidemia LDL goal < 100   . Gout   . Alzheimer's disease   . Hypertension, benign essential, goal below 140/90   . Senile osteoporosis   . Edema     CBC 9/292014  WBC 5.1  9.6/28.9  PLT 234   CMP 04/16/2013   142, 4.5, 106, 29, 96  24/1.78   Alb  3.1   Assessment and Plan  Hypertension, benign essential, goal below 140/90 Pt has 1+ pitting edema of feet and legs; SBP is all over the place -123 up 170, no apparent pattern; Pt has CRI  Cr 1.78; REC #1 lasix 20 mg q am-may need to go to 40 mg because of renal function, but will have to watch renal function and # 2 TED hose    Margit Hanks, MD

## 2013-05-11 NOTE — Assessment & Plan Note (Signed)
Pt has 1+ pitting edema of feet and legs; SBP is all over the place -123 up 170, no apparent pattern; Pt has CRI  Cr 1.78; REC #1 lasix 20 mg q am-may need to go to 40 mg because of renal function, but will have to watch renal function and # 2 TED hose

## 2013-05-23 NOTE — Progress Notes (Signed)
Patient ID: Vickie [name], female   DOB: June 23, 1913, 79 y.o.   MRN: [id number]  [facility]  No Known Allergies  Chief Complaint  Patient presents with  . Medical Managment of Chronic Issues    HPI  She is being seen for the management of her chronic illnesses.  There are no concerns being voiced by the nursing staff at this time. Overall her status remains without change. She is unable to participate in the hpi or ros.   Past Medical History  Diagnosis Date  . Diabetes mellitus type II, controlled   . Hyperlipidemia LDL goal < 100   . Gout   . Alzheimer's disease   . Hypertension, benign essential, goal below 140/90   . Senile osteoporosis   . Edema     No past surgical history on file.  Filed Vitals:   11/14/12 2343  BP: 128/74  Pulse: 70  Height: 5\' 1"  (1.549 m)  Weight: 147 lb (66.679 kg)    MEDICATIONS  Allopurinol 100 mg daily aricept 10 mg nightly Ca++ 100/215 mg tid Calcitonin daily Lasix 40 mg daily lumingan to both eyes nightly mvi with iron daily namenda 10 mg twice daily K+ 20 meq daily   LABS REVIEWED:  None recent   Review of Systems  Unable to perform ROS   Physical Exam  Constitutional: She appears well-developed and well-nourished.  Neck: Neck supple. No JVD present.  Cardiovascular: Normal rate, regular rhythm and intact distal pulses.   Respiratory: Effort normal and breath sounds normal. No respiratory distress. She has no wheezes.  GI: Soft. Bowel sounds are normal. She exhibits no distension. There is no tenderness.  Musculoskeletal: She exhibits no edema.  Is able to move all extremities   Skin: Skin is warm and dry.      ASSESSMENT/PLAN  1. Hypertension is stable; is not on medications; due to her advanced age it is highly unlikely that she will need medications for this condition. Will continue to monitor  2. Gout: will continue allopurinol 100 mg daily there are have no recent flares  3. Alzheimer's disease: without  significant change in status; will continue aricept 10 mg nightly and namenda 10 mg twice daily  4. Edema: stable will continue lasix 40 mg daily with k+ 20 meq daily  5. Glaucoma: will continue lumingan to both eyes nightly

## 2013-05-25 ENCOUNTER — Non-Acute Institutional Stay (SKILLED_NURSING_FACILITY): Payer: Medicare Other | Admitting: Internal Medicine

## 2013-05-25 DIAGNOSIS — E785 Hyperlipidemia, unspecified: Secondary | ICD-10-CM

## 2013-05-25 NOTE — Progress Notes (Signed)
Patient ID: Vickie [name], female   DOB: 04/22/1913, 79 y.o.   MRN: [id number] Location:  [name] Provider:  [name]. Renato Gails, D.O., C.M.D.  Code Status:  DNR   Chief Complaint  Patient presents with  . Acute Visit    cholesterol elevated    HPI:  79 yo female here for long term care--has vascular dementia, remains quite functional, cholesterol is grossly abnormal.  Review of Systems:  Review of Systems  Unable to perform ROS: dementia    Medications: Patient's Medications  New Prescriptions   No medications on file  Previous Medications   ALLOPURINOL (ZYLOPRIM) 100 MG TABLET    Take 100 mg by mouth daily.   BIMATOPROST (LUMIGAN) 0.03 % OPHTHALMIC SOLUTION    Place 1 drop into both eyes at bedtime.   CALCITONIN, SALMON, (MIACALCIN/FORTICAL) 200 UNIT/ACT NASAL SPRAY    Place 1 spray into the nose daily.   CALCIUM-VITAMIN D (OSCAL WITH D) 500-200 MG-UNIT PER TABLET    Take 1 tablet by mouth 3 (three) times daily.   CARBOXYMETHYLCELLULOSE (REFRESH PLUS) 0.5 % SOLN    Place 1 drop into both eyes 4 (four) times daily.   DONEPEZIL (ARICEPT) 10 MG TABLET    Take 10 mg by mouth at bedtime.   MEMANTINE HCL ER (NAMENDA XR) 28 MG CP24    Take 28 mg by mouth daily.   MULTIPLE VITAMINS-IRON (MULTIVITAMINS WITH IRON) TABS TABLET    Take 1 tablet by mouth daily.  Modified Medications   No medications on file  Discontinued Medications   No medications on file    Physical Exam: Physical Exam  Vitals reviewed. Cardiovascular: Normal rate, regular rhythm, normal heart sounds and intact distal pulses.   Pulmonary/Chest: Effort normal and breath sounds normal. No respiratory distress.  Abdominal: Soft. Bowel sounds are normal. She exhibits no distension and no mass. There is no tenderness.  Neurological: She is alert.  Skin: Skin is warm and dry.     Labs reviewed: 05/25/13:  BUN 17, cr 1.29 (improved), Na 142, K 4.4; hba1c 6.9; tc 224, TG 170, LDL 146, HDL  44  Assessment/Plan 1. Hyperlipidemia LDL goal < 100 -begin pravachol 20mg  qhs and recheck flp in 6 wks due to gross elevation of ldl and her diabetes (CAD equivalent)  Labs/tests ordered:  F/u flp in 6 wks

## 2013-05-29 NOTE — Progress Notes (Signed)
Patient ID: Vickie [name], female   DOB: Feb 11, 1913, 79 y.o.   MRN: [id number]  [facility]  No Known Allergies Chief Complaint  Patient presents with  . Medical Managment of Chronic Issues    HPI  She is being seen today for the management of her chronic illnesses.  She is unable to fully participate in the hpi or ros. There are no concerns being voiced by the nursing staff at this time. There is no recent change in her overall status.   Past Medical History  Diagnosis Date  . Diabetes mellitus type II, controlled   . Hyperlipidemia LDL goal < 100   . Gout   . Alzheimer's disease   . Hypertension, benign essential, goal below 140/90   . Senile osteoporosis   . Edema     No past surgical history on file.      MEDICATIONS  Allopurinol 100 mg daily aricept 10 mg nightly Ca++ 100/215 mg tid Calcitonin daily Lasix 40 mg daily lumingan to both eyes nightly mvi with iron daily namenda 10 mg twice daily K+ 20 meq daily   LABS REVIEWED:  None recent   Review of Systems  Unable to perform ROS   Physical Exam  Constitutional: She appears well-developed and well-nourished.  Neck: Neck supple. No JVD present.  Cardiovascular: Normal rate, regular rhythm and intact distal pulses.   Respiratory: Effort normal and breath sounds normal. No respiratory distress. She has no wheezes.  GI: Soft. Bowel sounds are normal. She exhibits no distension. There is no tenderness.  Musculoskeletal: She exhibits no edema.  Is able to move all extremities   Skin: Skin is warm and dry.      ASSESSMENT/PLAN  1. Hypertension is stable; is not on medications; due to her advanced age it is highly unlikely that she will need medications for this condition. Will continue to monitor  2. Gout: will continue allopurinol 100 mg daily there are have no recent flares  3. Alzheimer's disease: without significant change in status; will continue aricept 10 mg nightly will change her namenda to  xr 28 mg daily   4. Edema: stable will continue lasix 40 mg daily with k+ 20 meq daily  5. Glaucoma: will continue lumingan to both eyes nightly   Will check cbc; cmp; hgb a1c next draw.

## 2013-06-04 ENCOUNTER — Non-Acute Institutional Stay (SKILLED_NURSING_FACILITY): Payer: Medicare Other | Admitting: Nurse Practitioner

## 2013-06-04 DIAGNOSIS — L918 Other hypertrophic disorders of the skin: Secondary | ICD-10-CM

## 2013-06-04 DIAGNOSIS — L909 Atrophic disorder of skin, unspecified: Secondary | ICD-10-CM

## 2013-06-04 NOTE — Progress Notes (Signed)
Patient ID: Vickie [name], female   DOB: 1913/02/22, 79 y.o.   MRN: [id number] Nursing Home Location:  [facility]   Place of Service: SNF (31)  PCP: REED, TIFFANY, DO   No Known Allergies  Chief Complaint  Patient presents with  . Acute Visit    HPI:  79 year old female who is a LTR of [facility] with PHM of DM, dementia, gout, edema, HTN is being seen today at request of nursing; pts niece reported to staff she had some bleeding on her skin. Treatment nurse reported skin tag and now I have been asked to look at this; pt with advanced dementia and reports it occasionally bothers her.   Review of Systems:  Unable to obtain  Past Medical History  Diagnosis Date  . Diabetes mellitus type II, controlled   . Hyperlipidemia LDL goal < 100   . Gout   . Alzheimer's disease   . Hypertension, benign essential, goal below 140/90   . Senile osteoporosis   . Edema    No past surgical history on file. Social History:   reports that she does not drink alcohol or use illicit drugs. Her tobacco history is not on file.  No family history on file.  Medications: Patient's Medications  New Prescriptions   No medications on file  Previous Medications   ALLOPURINOL (ZYLOPRIM) 100 MG TABLET    Take 100 mg by mouth daily.   BIMATOPROST (LUMIGAN) 0.03 % OPHTHALMIC SOLUTION    Place 1 drop into both eyes at bedtime.   CALCITONIN, SALMON, (MIACALCIN/FORTICAL) 200 UNIT/ACT NASAL SPRAY    Place 1 spray into the nose daily.   CALCIUM-VITAMIN D (OSCAL WITH D) 500-200 MG-UNIT PER TABLET    Take 1 tablet by mouth 3 (three) times daily.   CARBOXYMETHYLCELLULOSE (REFRESH PLUS) 0.5 % SOLN    Place 1 drop into both eyes 4 (four) times daily.   DONEPEZIL (ARICEPT) 10 MG TABLET    Take 10 mg by mouth at bedtime.   LISINOPRIL (PRINIVIL,ZESTRIL) 2.5 MG TABLET    Take 2.5 mg by mouth daily.   MEMANTINE HCL ER (NAMENDA XR) 28 MG CP24    Take 28 mg by mouth daily.   MULTIPLE VITAMINS-IRON  (MULTIVITAMINS WITH IRON) TABS TABLET    Take 1 tablet by mouth daily.   PRAVASTATIN (PRAVACHOL) 20 MG TABLET    Take 20 mg by mouth daily.  Modified Medications   No medications on file  Discontinued Medications   No medications on file     Physical Exam:  Filed Vitals:   06/04/13 1509  BP: 149/86  Pulse: 59  Temp: 98.3 F (36.8 C)  Resp: 20  SpO2: 94%    Physical Exam  Constitutional: She is well-developed, well-nourished, and in no distress. No distress.  Cardiovascular: Normal rate, regular rhythm and normal heart sounds.   Pulmonary/Chest: Effort normal and breath sounds normal. No respiratory distress.  Abdominal: Soft. Bowel sounds are normal. She exhibits no distension. There is no tenderness.  Skin: Skin is warm and dry. She is not diaphoretic. No erythema.  0.8 cm skin tag that is round; old blood noted with scab to area; no active bleeding    Assessment/Plan 1. Skin tag Will have pt sent to dermatology to have tag removed  Staff to monitor area and cover if area starts to bleed  2. Follow up bmp and cbc

## 2013-06-10 ENCOUNTER — Encounter: Payer: Self-pay | Admitting: Internal Medicine

## 2013-06-10 ENCOUNTER — Non-Acute Institutional Stay (SKILLED_NURSING_FACILITY): Payer: Medicare Other | Admitting: Internal Medicine

## 2013-06-10 DIAGNOSIS — I10 Essential (primary) hypertension: Secondary | ICD-10-CM

## 2013-06-10 DIAGNOSIS — D631 Anemia in chronic kidney disease: Secondary | ICD-10-CM | POA: Insufficient documentation

## 2013-06-10 DIAGNOSIS — N183 Chronic kidney disease, stage 3 unspecified: Secondary | ICD-10-CM | POA: Insufficient documentation

## 2013-06-10 DIAGNOSIS — N189 Chronic kidney disease, unspecified: Secondary | ICD-10-CM

## 2013-06-10 NOTE — Progress Notes (Signed)
This encounter was created in error - please disregard.

## 2013-06-10 NOTE — Assessment & Plan Note (Signed)
Pt' pedal edema is gone-she is wearing TED hose; her BP is high today -will increase Lisinopril to 5 mg daily

## 2013-06-10 NOTE — Assessment & Plan Note (Addendum)
11/20  10.2/32.9,   Improved from  9/29  9.6/28.9   MCV  82.3; pt is not on iron, will check iron levels

## 2013-06-10 NOTE — Assessment & Plan Note (Addendum)
BUN/Cr 11/20 19/1.39; 11/7  Cr 1.29   10/24  Cr 1.78;  GFR  37.5; will continue surveillance

## 2013-06-10 NOTE — Progress Notes (Signed)
MRN: [id number] Name: Vickie [name]  Sex: female Age: 79 y.o. DOB: May 13, 1913  PSC #:  Facility/Room: Level Of Care: SNF Provider: Merrilee Seashore D Emergency Contacts: Extended Emergency Contact Information Primary Emergency Contact: Smith,Roslyn Address: [address] [place], [place] [address] [place] Home Phone: [phone] Relation: Other  Code Status:   Allergies: Review of patient's allergies indicates no known allergies.  Chief Complaint  Patient presents with  . review of labs  . Medical Managment of Chronic Issues    HPI: Patient is 79 y.o. female who   Past Medical History  Diagnosis Date  . Diabetes mellitus type II, controlled   . Hyperlipidemia LDL goal < 100   . Gout   . Alzheimer's disease   . Hypertension, benign essential, goal below 140/90   . Senile osteoporosis   . Edema     History reviewed. No pertinent past surgical history.    Medication List       This list is accurate as of: 06/10/13  3:40 PM.  Always use your most recent med list.               allopurinol 100 MG tablet  Commonly known as:  ZYLOPRIM  Take 100 mg by mouth daily.     bimatoprost 0.03 % ophthalmic solution  Commonly known as:  LUMIGAN  Place 1 drop into both eyes at bedtime.     calcitonin (salmon) 200 UNIT/ACT nasal spray  Commonly known as:  MIACALCIN/FORTICAL  Place 1 spray into the nose daily.     calcium-vitamin D 500-200 MG-UNIT per tablet  Commonly known as:  OSCAL WITH D  Take 1 tablet by mouth 3 (three) times daily.     carboxymethylcellulose 0.5 % Soln  Commonly known as:  REFRESH PLUS  Place 1 drop into both eyes 4 (four) times daily.     donepezil 10 MG tablet  Commonly known as:  ARICEPT  Take 10 mg by mouth at bedtime.     lisinopril 2.5 MG tablet  Commonly known as:  PRINIVIL,ZESTRIL  Take 2.5 mg by mouth daily.     multivitamins with iron Tabs tablet  Take 1 tablet by mouth daily.     NAMENDA  XR 28 MG Cp24  Generic drug:  Memantine HCl ER  Take 28 mg by mouth daily.     pravastatin 20 MG tablet  Commonly known as:  PRAVACHOL  Take 20 mg by mouth daily.        No orders of the defined types were placed in this encounter.    Immunization History  Administered Date(s) Administered  . Influenza Whole 04/30/2013    History  Substance Use Topics  . Smoking status: Unknown If Ever Smoked  . Smokeless tobacco: Not on file  . Alcohol Use: No    Review of Systems  DATA OBTAINED: from patient, nurse, medical record, family member GENERAL: Feels well no fevers, fatigue, appetite changes SKIN: No itching, rash HEENT: No complaint RESPIRATORY: No cough, wheezing, SOB CARDIAC: No chest pain, palpitations, lower extremity edema  GI: No abdominal pain, No N/V/D or constipation, No heartburn or reflux  GU: No dysuria, frequency or urgency, or incontinence  MUSCULOSKELETAL: No unrelieved bone/joint pain NEUROLOGIC: No headache, dizziness or focal weakness PSYCHIATRIC: No overt anxiety or sadness. Sleeps well.   Filed Vitals:   06/10/13 1524  BP: 156/78  Pulse: 60  Temp: 98 F (36.7  C)  Resp: 20    Physical Exam  GENERAL APPEARANCE: Alert, conversant. Appropriately groomed. No acute distress  SKIN: No diaphoresis rash, or wounds HEENT: Unremarkable RESPIRATORY: Breathing is even, unlabored. Lung sounds are clear   CARDIOVASCULAR: Heart RRR no murmurs, rubs or gallops. No peripheral edema  GASTROINTESTINAL: Abdomen is soft, non-tender, not distended w/ normal bowel sounds.  GENITOURINARY: Bladder non tender, not distended  MUSCULOSKELETAL: No abnormal joints or musculature NEUROLOGIC: Cranial nerves 2-12 grossly intact. Moves all extremities no tremor. PSYCHIATRIC: Mood and affect appropriate to situation, no behavioral issues  Patient Active Problem List   Diagnosis Date Noted  . CRI (chronic renal insufficiency) 06/10/2013  . Anemia in chronic kidney disease  06/10/2013  . Diabetes mellitus type II, controlled   . Hyperlipidemia LDL goal < 100   . Gout   . Alzheimer's disease   . Hypertension, benign essential, goal below 140/90   . Senile osteoporosis   . Edema     CBC - see in A/P   CMP - see in A/P   Assessment and Plan  Hypertension, benign essential, goal below 140/90 Pt' pedal edema is gone-she is wearing TED hose; her BP is high today -will increase Lisinopril to 5 mg daily  CRI (chronic renal insufficiency) BUN/Cr 11/20 19/1.39; 11/7  Cr 1.29   10/24  Cr 1.78; woo continue surveillance  Anemia in chronic kidney disease 11/20  10.2/32.9,   Improved from  9/29  9.6/28.9   MCV  82.3; pt is not on iron, will check iron levels   Margit Hanks, MD

## 2013-06-25 ENCOUNTER — Non-Acute Institutional Stay (SKILLED_NURSING_FACILITY): Payer: Medicare Other | Admitting: Nurse Practitioner

## 2013-06-25 ENCOUNTER — Encounter: Payer: Self-pay | Admitting: Nurse Practitioner

## 2013-06-25 DIAGNOSIS — E785 Hyperlipidemia, unspecified: Secondary | ICD-10-CM

## 2013-06-25 DIAGNOSIS — F0280 Dementia in other diseases classified elsewhere without behavioral disturbance: Secondary | ICD-10-CM

## 2013-06-25 DIAGNOSIS — D631 Anemia in chronic kidney disease: Secondary | ICD-10-CM

## 2013-06-25 DIAGNOSIS — N189 Chronic kidney disease, unspecified: Secondary | ICD-10-CM

## 2013-06-25 DIAGNOSIS — I10 Essential (primary) hypertension: Secondary | ICD-10-CM

## 2013-06-25 NOTE — Progress Notes (Signed)
Patient ID: Vickie [name], female   DOB: 1913-05-04, 79 y.o.   MRN: [id number]    Nursing Home Location:  [facility]   Place of Service: SNF (31)  PCP: REED, TIFFANY, DO  No Known Allergies  Chief Complaint  Patient presents with  . Medical Managment of Chronic Issues    HPI:  79 year old female who is a LTR of [facility] with PHM of DM, dementia, gout, edema, HTN is being seen today for routine follow up. Pt is unable to participate in ROS or HPI due to dementia; staff does not have any concerns at this time. Nursing reports edema is still a problem at times but she does not wear TED hoses consistently; Awaiting appt for skin tag to be removed; not complaining of pain at this time.     Review of Systems:  unable to obtain due to dementia  Past Medical History  Diagnosis Date  . Diabetes mellitus type II, controlled   . Hyperlipidemia LDL goal < 100   . Gout   . Alzheimer's disease   . Hypertension, benign essential, goal below 140/90   . Senile osteoporosis   . Edema    No past surgical history on file. Social History:   reports that she does not drink alcohol or use illicit drugs. Her tobacco history is not on file.  No family history on file.  Medications: Patient's Medications  New Prescriptions   No medications on file  Previous Medications   ALLOPURINOL (ZYLOPRIM) 100 MG TABLET    Take 100 mg by mouth daily.   BIMATOPROST (LUMIGAN) 0.03 % OPHTHALMIC SOLUTION    Place 1 drop into both eyes at bedtime.   CALCITONIN, SALMON, (MIACALCIN/FORTICAL) 200 UNIT/ACT NASAL SPRAY    Place 1 spray into the nose daily.   CALCIUM-VITAMIN D (OSCAL WITH D) 500-200 MG-UNIT PER TABLET    Take 1 tablet by mouth 3 (three) times daily.   CARBOXYMETHYLCELLULOSE (REFRESH PLUS) 0.5 % SOLN    Place 1 drop into both eyes 4 (four) times daily.   DONEPEZIL (ARICEPT) 10 MG TABLET    Take 10 mg by mouth at bedtime.   LISINOPRIL (PRINIVIL,ZESTRIL) 2.5 MG TABLET    Take 5 mg by  mouth daily.    MEMANTINE HCL ER (NAMENDA XR) 28 MG CP24    Take 28 mg by mouth daily.   MULTIPLE VITAMINS-IRON (MULTIVITAMINS WITH IRON) TABS TABLET    Take 1 tablet by mouth daily.   PRAVASTATIN (PRAVACHOL) 20 MG TABLET    Take 20 mg by mouth daily.  Modified Medications   No medications on file  Discontinued Medications   No medications on file     Physical Exam:  Filed Vitals:   06/25/13 1547  BP: 133/82  Pulse: 60  Temp: 97.6 F (36.4 C)  Resp: 20    Physical Exam  Constitutional: She is well-developed, well-nourished, and in no distress. No distress.  HENT:  Head: Normocephalic.  Mouth/Throat: No oropharyngeal exudate.  Neck: Normal range of motion. Neck supple. No thyromegaly present.  Cardiovascular: Normal rate, regular rhythm and normal heart sounds.   Pulmonary/Chest: Effort normal and breath sounds normal. No respiratory distress.  Abdominal: Soft. Bowel sounds are normal. She exhibits no distension. There is no tenderness.  Musculoskeletal: Normal range of motion. She exhibits no edema and no tenderness.  Lymphadenopathy:    She has no cervical adenopathy.  Neurological: She is alert.  Skin: Skin is warm and dry. She is  not diaphoretic. No erythema.  Psychiatric: Affect normal.     Labs reviewed: 05/25/13 Cholesterol 224, trig 170, ldl 146, HDL 44 Glucose 94, BUN 17, Cr  1.29, sodium 142, potassium 4.4 Assessment/Plan 1. Alzheimer's disease -Patient is stable; will cont current medications   2. Hyperlipidemia LDL goal < 100 -was started on Pravachol due to elevations in LDL; will cont to monitor  3. Anemia in chronic kidney disease -11/20 hgb/hct 10.2/32.9 which Improved from 9/29 9.6/28.9 MCV 82.3; iron levels pending  4. Hypertension, benign essential, goal below 140/90 -stable at this time; will follow up BMP in 1 month due to increase in lisinopril   5. Chronic renal disease, stage 3, moderately decreased glomerular filtration rate (GFR)  between 30-59 mL/min/1.73 square meter -stable at this time; will cont to monitor

## 2013-06-28 ENCOUNTER — Encounter: Payer: Self-pay | Admitting: Internal Medicine

## 2013-06-28 ENCOUNTER — Non-Acute Institutional Stay (SKILLED_NURSING_FACILITY): Payer: Medicare Other | Admitting: Internal Medicine

## 2013-06-28 DIAGNOSIS — R609 Edema, unspecified: Secondary | ICD-10-CM

## 2013-06-28 DIAGNOSIS — I10 Essential (primary) hypertension: Secondary | ICD-10-CM

## 2013-06-28 DIAGNOSIS — E785 Hyperlipidemia, unspecified: Secondary | ICD-10-CM

## 2013-06-28 DIAGNOSIS — E119 Type 2 diabetes mellitus without complications: Secondary | ICD-10-CM

## 2013-06-28 NOTE — Progress Notes (Signed)
MRN: [id number] Name: Vickie [name]  Sex: female Age: 79 y.o. DOB: [birth year]  PSC #: Ronni Rumble Facility/Room: 230 Level Of Care: SNF Provider: Merrilee Seashore D Emergency Contacts: Extended Emergency Contact Information Primary Emergency Contact: Smith,Roslyn Address: [address] [place], [place] [address] [place] Home Phone: [phone] Relation: Other  Code Status:   Allergies: Review of patient's allergies indicates no known allergies.  Chief Complaint  Patient presents with  . Acute Visit    HPI: Patient is 79 y.o. female who the nurse asked me to see for her LE edema.  Past Medical History  Diagnosis Date  . Diabetes mellitus type II, controlled   . Hyperlipidemia LDL goal < 100   . Gout   . Alzheimer's disease   . Hypertension, benign essential, goal below 140/90   . Senile osteoporosis   . Edema     History reviewed. No pertinent past surgical history.    Medication List       This list is accurate as of: 06/28/13  6:35 PM.  Always use your most recent med list.               allopurinol 100 MG tablet  Commonly known as:  ZYLOPRIM  Take 100 mg by mouth daily.     bimatoprost 0.03 % ophthalmic solution  Commonly known as:  LUMIGAN  Place 1 drop into both eyes at bedtime.     calcitonin (salmon) 200 UNIT/ACT nasal spray  Commonly known as:  MIACALCIN/FORTICAL  Place 1 spray into the nose daily.     calcium-vitamin D 500-200 MG-UNIT per tablet  Commonly known as:  OSCAL WITH D  Take 1 tablet by mouth 3 (three) times daily.     carboxymethylcellulose 0.5 % Soln  Commonly known as:  REFRESH PLUS  Place 1 drop into both eyes 4 (four) times daily.     donepezil 10 MG tablet  Commonly known as:  ARICEPT  Take 10 mg by mouth at bedtime.     lisinopril 2.5 MG tablet  Commonly known as:  PRINIVIL,ZESTRIL  Take 5 mg by mouth daily.     multivitamins with iron Tabs tablet  Take 1 tablet by mouth daily.      NAMENDA XR 28 MG Cp24  Generic drug:  Memantine HCl ER  Take 28 mg by mouth daily.     pravastatin 20 MG tablet  Commonly known as:  PRAVACHOL  Take 20 mg by mouth daily.        No orders of the defined types were placed in this encounter.    Immunization History  Administered Date(s) Administered  . Influenza Whole 04/30/2013    History  Substance Use Topics  . Smoking status: Unknown If Ever Smoked  . Smokeless tobacco: Not on file  . Alcohol Use: No    Review of Systems  DATA OBTAINED: from patient, nurse, family member- says swelling is better today;also sometimes won't let nurses put ON stockings in am GENERAL: Feels well no fevers, fatigue, appetite changes SKIN: No itching, rash HEENT: No complaint RESPIRATORY: No cough, wheezing, SOB CARDIAC: No chest pain, palpitations, lower extremity edema  GI: No abdominal pain, No N/V/D or constipation, No heartburn or reflux  GU: No dysuria, frequency or urgency, or incontinence  MUSCULOSKELETAL: No unrelieved bone/joint pain NEUROLOGIC: No headache, dizziness or focal weakness PSYCHIATRIC: No overt anxiety or sadness. Sleeps well.   Filed Vitals:  06/28/13 1446  BP: 132/82  Pulse: 59  Temp: 97.6 F (36.4 C)  Resp: 18    Physical Exam  GENERAL APPEARANCE: Alert, conversant. Appropriately groomed. No acute distress  SKIN: No diaphoresis rash, or wounds HEENT: Unremarkable RESPIRATORY: Breathing is even, unlabored. Lung sounds are clear   CARDIOVASCULAR: Heart RRR no murmurs, rubs or gallops. trace edema to feet only GASTROINTESTINAL: Abdomen is soft, non-tender, not distended w/ normal bowel sounds.  GENITOURINARY: Bladder non tender, not distended  MUSCULOSKELETAL: No abnormal joints or musculature NEUROLOGIC: Cranial nerves 2-12 grossly intact. Moves all extremities no tremor. PSYCHIATRIC: Mood and affect appropriate to situation, no behavioral issues  Patient Active Problem List   Diagnosis Date Noted   . Chronic renal disease, stage 3, moderately decreased glomerular filtration rate (GFR) between 30-59 mL/min/1.73 square meter 06/10/2013  . Anemia in chronic kidney disease 06/10/2013  . Diabetes mellitus type II, controlled   . Hyperlipidemia LDL goal < 100   . Gout   . Alzheimer's disease   . Hypertension, benign essential, goal below 140/90   . Senile osteoporosis   . Edema         Assessment and Plan  Edema Pt is apparently having some LE edema days she won't let nurses put on stockings. I would definitely NOT tx this with any meds ; will continue stockngs    Margit Hanks, MD

## 2013-06-28 NOTE — Assessment & Plan Note (Signed)
Pt is apparently having some LE edema days she won't let nurses put on stockings. I would definitely NOT tx this with any meds ; will continue stockngs

## 2013-08-14 ENCOUNTER — Encounter: Payer: Self-pay | Admitting: Internal Medicine

## 2013-08-14 ENCOUNTER — Non-Acute Institutional Stay (SKILLED_NURSING_FACILITY): Payer: Medicare Other | Admitting: Internal Medicine

## 2013-08-14 DIAGNOSIS — R05 Cough: Secondary | ICD-10-CM

## 2013-08-14 DIAGNOSIS — R059 Cough, unspecified: Secondary | ICD-10-CM

## 2013-08-14 NOTE — Progress Notes (Signed)
MRN: [id number] Name: Vickie [name]  Sex: female Age: 80 y.o. DOB: 06/15/1913  PSC #: [facility] Facility/Room:  233A Level Of Care: SNF Provider: Merrilee Seashore D Emergency Contacts: Extended Emergency Contact Information Primary Emergency Contact: Smith,Roslyn Address: [address] [place], [place] [address] [place] Home Phone: [phone] Relation: Other  Code Status:  Allergies: Review of patient's allergies indicates no known allergies.  Chief Complaint  Patient presents with  . Acute Visit    HPI: Patient is 80 y.o. female whose daughter would like me to see her mother. Her mother has had a cough and the daughter has ben sick with some respiratory thing which did not keep her from coming to SNF to visit.   Past Medical History  Diagnosis Date  . Diabetes mellitus type II, controlled   . Hyperlipidemia LDL goal < 100   . Gout   . Alzheimer's disease   . Hypertension, benign essential, goal below 140/90   . Senile osteoporosis   . Edema     History reviewed. No pertinent past surgical history.    Medication List       This list is accurate as of: 08/14/13  6:57 PM.  Always use your most recent med list.               allopurinol 100 MG tablet  Commonly known as:  ZYLOPRIM  Take 100 mg by mouth daily.     bimatoprost 0.03 % ophthalmic solution  Commonly known as:  LUMIGAN  Place 1 drop into both eyes at bedtime.     calcitonin (salmon) 200 UNIT/ACT nasal spray  Commonly known as:  MIACALCIN/FORTICAL  Place 1 spray into the nose daily.     calcium-vitamin D 500-200 MG-UNIT per tablet  Commonly known as:  OSCAL WITH D  Take 1 tablet by mouth 3 (three) times daily.     carboxymethylcellulose 0.5 % Soln  Commonly known as:  REFRESH PLUS  Place 1 drop into both eyes 4 (four) times daily.     donepezil 10 MG tablet  Commonly known as:  ARICEPT  Take 10 mg by mouth at bedtime.     lisinopril 2.5 MG tablet   Commonly known as:  PRINIVIL,ZESTRIL  Take 5 mg by mouth daily.     multivitamins with iron Tabs tablet  Take 1 tablet by mouth daily.     NAMENDA XR 28 MG Cp24  Generic drug:  Memantine HCl ER  Take 28 mg by mouth daily.     pravastatin 20 MG tablet  Commonly known as:  PRAVACHOL  Take 20 mg by mouth daily.        No orders of the defined types were placed in this encounter.    Immunization History  Administered Date(s) Administered  . Influenza Whole 04/30/2013  . Pneumococcal-Unspecified 05/19/2006    History  Substance Use Topics  . Smoking status: Unknown If Ever Smoked  . Smokeless tobacco: Not on file  . Alcohol Use: No    Review of Systems  DATA OBTAINED: from patient, nurse GENERAL: Feels well no fevers, fatigue, appetite changes SKIN: No itching, rash HEENT: No complaint RESPIRATORY: + cough, no wheezing, no SOB; cough not productive;admits does have nasal d/c CARDIAC: No chest pain, palpitations, lower extremity edema  GI: No abdominal pain, No N/V/D or constipation, No heartburn or reflux  GU: No dysuria, frequency or urgency, or incontinence  MUSCULOSKELETAL: No  unrelieved bone/joint pain NEUROLOGIC: No headache, dizziness or focal weakness PSYCHIATRIC: No overt anxiety or sadness. Sleeps well.   Filed Vitals:   08/14/13 1855  BP: 105/59  Pulse: 58  Temp: 98 F (36.7 C)  Resp: 20    Physical Exam  GENERAL APPEARANCE: Alert, conversant. Appropriately groomed. No acute distress  SKIN: No diaphoresis rash HEENT: Unremarkable RESPIRATORY: Breathing is even, unlabored. Lung sounds are clear  Bedside O2 sat per ADAMD was 96% with pulsr 78 CARDIOVASCULAR: Heart RRR no murmurs, rubs or gallops. No peripheral edema  GASTROINTESTINAL: Abdomen is soft, non-tender, not distended w/ normal bowel sounds.  GENITOURINARY: Bladder non tender, not distended  MUSCULOSKELETAL: No abnormal joints or musculature NEUROLOGIC: Cranial nerves 2-12 grossly  intact. Moves all extremities no tremor. PSYCHIATRIC: Mood and affect appropriate to situation, no behavioral issues  Patient Active Problem List   Diagnosis Date Noted  . Chronic renal disease, stage 3, moderately decreased glomerular filtration rate (GFR) between 30-59 mL/min/1.73 square meter 06/10/2013  . Anemia in chronic kidney disease 06/10/2013  . Diabetes mellitus type II, controlled   . Hyperlipidemia LDL goal < 100   . Gout   . Alzheimer's disease   . Hypertension, benign essential, goal below 140/90   . Senile osteoporosis   . Edema        Assessment and Plan  COUGH WITH EXPOSURE TO URI-  Pt did cough a little, not wet but does not appear her usual self; at her age presentation could be very subtle -will order CXR  Margit HanksALEXANDER, ANNE D, MD

## 2013-09-08 ENCOUNTER — Encounter: Payer: Self-pay | Admitting: Internal Medicine

## 2013-10-24 ENCOUNTER — Encounter: Payer: Self-pay | Admitting: Internal Medicine

## 2013-10-24 ENCOUNTER — Non-Acute Institutional Stay (SKILLED_NURSING_FACILITY): Payer: Medicare Other | Admitting: Internal Medicine

## 2013-10-24 DIAGNOSIS — G309 Alzheimer's disease, unspecified: Secondary | ICD-10-CM

## 2013-10-24 DIAGNOSIS — E1129 Type 2 diabetes mellitus with other diabetic kidney complication: Secondary | ICD-10-CM

## 2013-10-24 DIAGNOSIS — E785 Hyperlipidemia, unspecified: Secondary | ICD-10-CM

## 2013-10-24 DIAGNOSIS — N183 Chronic kidney disease, stage 3 unspecified: Secondary | ICD-10-CM

## 2013-10-24 DIAGNOSIS — I10 Essential (primary) hypertension: Secondary | ICD-10-CM

## 2013-10-24 DIAGNOSIS — F0280 Dementia in other diseases classified elsewhere without behavioral disturbance: Secondary | ICD-10-CM

## 2013-10-24 DIAGNOSIS — M810 Age-related osteoporosis without current pathological fracture: Secondary | ICD-10-CM

## 2013-10-24 NOTE — Progress Notes (Signed)
Patient ID: Vickie [name], female   DOB: 02/04/1913, 80 y.o.   MRN: [id number]  Provider:  Gwenith Spitziffany L. Renato Gailseed, D.O., C.M.D.  [facility]  PCP: Bufford SpikesEED, Randee Upchurch, DO  Code Status: DNR  Allergies  Allergen Reactions  . Beta Adrenergic Blockers     Because of first degree AV block    Chief Complaint  Patient presents with  . Medical Managment of Chronic Issues    HPI: 80 y.o. female with h/o DMII that is well controlled, hyperlipidemia, AD, gout, senile osteoporosis here for long term care.  She is doing well.  [facility]  She has no c/o pain, her bowels are moving, appetite is good.  She continues to be verbal, participate in activities, can ambulate within her room.  She sleeps well.   ROS: Review of Systems  Constitutional: Negative for fever and malaise/fatigue.  HENT: Negative for congestion.   Eyes: Negative for blurred vision.  Respiratory: Negative for shortness of breath.   Cardiovascular: Negative for chest pain and leg swelling.  Gastrointestinal: Negative for abdominal pain, constipation, blood in stool and melena.  Genitourinary: Negative for dysuria.  Musculoskeletal: Negative for falls and myalgias.  Skin: Negative for rash.  Neurological: Negative for dizziness and loss of consciousness.  Psychiatric/Behavioral: Positive for memory loss. Negative for depression. The patient does not have insomnia.     Past Medical History  Diagnosis Date  . Diabetes mellitus type II, controlled   . Hyperlipidemia LDL goal < 100   . Gout   . Alzheimer's disease   . Hypertension, benign essential, goal below 140/90   . Senile osteoporosis   . Edema    History reviewed. No pertinent past surgical history. Social History:   reports that she does not drink alcohol or use illicit drugs. Her tobacco history is not on file.  History reviewed. No pertinent family history.  Medications: Patient's Medications  New  Prescriptions   No medications on file  Previous Medications   ALLOPURINOL (ZYLOPRIM) 100 MG TABLET    Take 100 mg by mouth 2 (two) times daily.    BIMATOPROST (LUMIGAN) 0.03 % OPHTHALMIC SOLUTION    Place 1 drop into both eyes at bedtime.   BRIMONIDINE-TIMOLOL (COMBIGAN) 0.2-0.5 % OPHTHALMIC SOLUTION    Place 1 drop into both eyes at bedtime.   CALCITONIN, SALMON, (MIACALCIN/FORTICAL) 200 UNIT/ACT NASAL SPRAY    Place 1 spray into the nose daily.   CALCIUM-VITAMIN D (OSCAL WITH D) 500-200 MG-UNIT PER TABLET    Take 1 tablet by mouth 3 (three) times daily.   CARBOXYMETHYLCELLULOSE (REFRESH PLUS) 0.5 % SOLN    Place 1 drop into both eyes 4 (four) times daily.   DONEPEZIL (ARICEPT) 10 MG TABLET    Take 10 mg by mouth at bedtime.   FUROSEMIDE (LASIX) 20 MG TABLET    Take 20 mg by mouth daily.   LISINOPRIL (PRINIVIL,ZESTRIL) 2.5 MG TABLET    Take 5 mg by mouth daily.    MEMANTINE HCL ER (NAMENDA XR) 28 MG CP24    Take 28 mg by mouth daily.   MULTIPLE VITAMINS-IRON (MULTIVITAMINS WITH IRON) TABS TABLET    Take 1 tablet by mouth daily.   POTASSIUM CHLORIDE ER 20 MEQ TBCR    Take 20 mEq by mouth daily.   PRAVASTATIN (PRAVACHOL) 20 MG TABLET    Take 20 mg by mouth daily.  Modified Medications   No medications on file  Discontinued  Medications   No medications on file     Physical Exam: Filed Vitals:   10/24/13 1554  BP: 136/67  Pulse: 59  Temp: 97 F (36.1 C)  Resp: 20  Weight: 139 lb (63.05 kg)  SpO2: 94%   Physical Exam  Constitutional: She is oriented to person, place, and time. She appears well-developed and well-nourished. No distress.  Pleasant black female seated in her wheelchair  HENT:  Head: Normocephalic and atraumatic.  Cardiovascular: Normal rate, regular rhythm, normal heart sounds and intact distal pulses.   Pulmonary/Chest: Effort normal and breath sounds normal.  Abdominal: Soft. Bowel sounds are normal. She exhibits no distension and no mass. There is no tenderness.   Neurological: She is alert and oriented to person, place, and time.  Skin: Skin is warm and dry.     Labs reviewed:  BMP: 10/04/2013: G 106, Ca 9.2, BUN 24, Cr 1.29, Na 145, K 4.3 07/05/2013: G 129, Ca 9.9, BUN 26, Cr 1.76, Na 143, K 4.8  CBC: 07/05/2013: WBC 8.4, Hgb 10, Hct 37.5, Plt 273  Influenzae: 07/05/2013: Positive for A, negative for B.  (Was treated with Tamiflu)  Imaging and Procedures:  CXR: 08/15/2013: Mild bronchitis. Mild bibasilar atelectasis (improved from previous x-ray).  Patchy inflammatory consolidate or atelectasis at the left lateral lung base.  (Treated with Zithromax)  Assessment/Plan 1. DM (diabetes mellitus) type II controlled with renal manifestation -needs hba1c next routine visit -has been well controlled with diet alone, ace, statin, not on baby asa, but 30 yo with dementia so would not start now  2. Chronic renal disease, stage 3, moderately decreased glomerular filtration rate (GFR) between 30-59 mL/min/1.73 square meter -cont lisinopril for renal protection in DM -avoid nsaids  3. Hypertension, benign essential, goal below 140/90 -bp at goal with ace only  4. Hyperlipidemia LDL goal < 100 -lipids due next visit -cont statin--is tolerating well and LDL had been very high  5. Alzheimer's disease -cont aricept and namenda -stable, still minimally ambulatory, verbal, able to assist with her adls and participates in activities  6. Senile osteoporosis -cont ca with D  7.  Gout -no recent flares -f/u uric acid and cont allopurinol  Functional status:  Needs some assistance with bathing, dressing, otherwise independent  Family/ staff Communication: no concerns voiced by nursing  Labs/tests ordered:  Uric acid and bmp

## 2013-10-25 ENCOUNTER — Encounter: Payer: Self-pay | Admitting: Internal Medicine

## 2014-01-23 ENCOUNTER — Non-Acute Institutional Stay (SKILLED_NURSING_FACILITY): Payer: Medicare Other | Admitting: Internal Medicine

## 2014-01-23 ENCOUNTER — Encounter: Payer: Self-pay | Admitting: Internal Medicine

## 2014-01-23 DIAGNOSIS — R5381 Other malaise: Secondary | ICD-10-CM

## 2014-01-23 DIAGNOSIS — S46909A Unspecified injury of unspecified muscle, fascia and tendon at shoulder and upper arm level, unspecified arm, initial encounter: Secondary | ICD-10-CM

## 2014-01-23 DIAGNOSIS — R5383 Other fatigue: Secondary | ICD-10-CM

## 2014-01-23 DIAGNOSIS — R531 Weakness: Secondary | ICD-10-CM

## 2014-01-23 DIAGNOSIS — S4980XA Other specified injuries of shoulder and upper arm, unspecified arm, initial encounter: Secondary | ICD-10-CM

## 2014-01-23 DIAGNOSIS — N183 Chronic kidney disease, stage 3 unspecified: Secondary | ICD-10-CM

## 2014-01-23 DIAGNOSIS — R627 Adult failure to thrive: Secondary | ICD-10-CM

## 2014-01-23 DIAGNOSIS — G309 Alzheimer's disease, unspecified: Secondary | ICD-10-CM

## 2014-01-23 DIAGNOSIS — I10 Essential (primary) hypertension: Secondary | ICD-10-CM

## 2014-01-23 DIAGNOSIS — S46002A Unspecified injury of muscle(s) and tendon(s) of the rotator cuff of left shoulder, initial encounter: Secondary | ICD-10-CM

## 2014-01-23 DIAGNOSIS — F0280 Dementia in other diseases classified elsewhere without behavioral disturbance: Secondary | ICD-10-CM

## 2014-01-23 DIAGNOSIS — E1129 Type 2 diabetes mellitus with other diabetic kidney complication: Secondary | ICD-10-CM

## 2014-01-23 NOTE — Progress Notes (Signed)
Patient ID: Vickie [name], female   DOB: 02/04/1913, 80 y.o.   MRN: [id number]  Location:  [facility] Provider:  Gwenith Spitziffany L. Renato Gailseed, D.O., C.M.D.  Code Status:  DNR   Chief Complaint  Patient presents with  . Acute Visit    declining;  has increase weakness of lower legs, memory worse, acting slow and dazed recently    HPI:  80 yo female here for long term care with Alzheimer's disease, DMII controlled with renal complications, anemia of CKDIII, senile osteoporosis, gout and hyperlipidemia seen due to decline as above.  Review of nursing notes indicates she fell 01/18/14 (denied hitting head) and had an episode of vomiting 7/4.  Weight had declined about 8 mos ago when she had influenza A but improved with recovery and with addition of 2 cal supplement.  We do not yet have this month's weight.    Review of Systems:  Review of Systems  Constitutional: Negative for fever and malaise/fatigue.  HENT: Negative for congestion.   Eyes: Negative for blurred vision.  Respiratory: Negative for shortness of breath.   Cardiovascular: Negative for chest pain and leg swelling.  Gastrointestinal: Negative for abdominal pain and constipation.  Genitourinary: Negative for dysuria.  Musculoskeletal: Positive for falls and joint pain.  Skin: Negative for rash.  Neurological: Negative for dizziness, loss of consciousness, weakness and headaches.  Psychiatric/Behavioral: Positive for memory loss.    Medications: Patient's Medications  New Prescriptions   No medications on file  Previous Medications   ALLOPURINOL (ZYLOPRIM) 100 MG TABLET    Take 100 mg by mouth 2 (two) times daily.    BIMATOPROST (LUMIGAN) 0.03 % OPHTHALMIC SOLUTION    Place 1 drop into both eyes at bedtime.   BRIMONIDINE-TIMOLOL (COMBIGAN) 0.2-0.5 % OPHTHALMIC SOLUTION    Place 1 drop into both eyes at bedtime.   CALCIUM-VITAMIN D (OSCAL WITH D) 500-200 MG-UNIT PER TABLET    Take 1 tablet by mouth 3 (three) times daily.    CARBOXYMETHYLCELLULOSE (REFRESH PLUS) 0.5 % SOLN    Place 1 drop into both eyes 4 (four) times daily.   DONEPEZIL (ARICEPT) 10 MG TABLET    Take 10 mg by mouth at bedtime.   FUROSEMIDE (LASIX) 20 MG TABLET    Take 20 mg by mouth daily.   LISINOPRIL (PRINIVIL,ZESTRIL) 2.5 MG TABLET    Take 5 mg by mouth daily.    MEMANTINE HCL ER (NAMENDA XR) 28 MG CP24    Take 28 mg by mouth daily.   MULTIPLE VITAMINS-IRON (MULTIVITAMINS WITH IRON) TABS TABLET    Take 1 tablet by mouth daily.   POTASSIUM CHLORIDE ER 20 MEQ TBCR    Take 20 mEq by mouth daily.   PRAVASTATIN (PRAVACHOL) 20 MG TABLET    Take 20 mg by mouth daily.  Modified Medications   No medications on file  Discontinued Medications   CALCITONIN, SALMON, (MIACALCIN/FORTICAL) 200 UNIT/ACT NASAL SPRAY    Place 1 spray into the nose daily.    Physical Exam: Filed Vitals:   01/23/14 1639  BP: 150/86  Pulse: 55  Temp: 97.5 F (36.4 C)  Resp: 18  Height: 5\' 1"  (1.549 m)  Weight: 138 lb (62.596 kg)  Physical Exam  Constitutional: She appears well-developed and well-nourished. No distress.  Cardiovascular: Normal rate, regular rhythm, normal heart sounds and intact distal pulses.   Pulmonary/Chest: Effort normal and breath sounds normal. No respiratory distress.  Abdominal: Soft. Bowel sounds are normal. She exhibits no distension and no  mass. There is no tenderness.  Musculoskeletal:  Left shoulder with decreased ROM--seems to be due to pain;  Tenderness over rotator cuff insertion site  Neurological: She is alert.  Oriented x 2  Skin: Skin is warm and dry.  Psychiatric: She has a normal mood and affect.    Labs reviewed: 10/25/13:  Glucose 145, BUN 25, cr 1.55, Na 143, K 4.8  Assessment/Plan 1. Generalized weakness -seems to have declined  -may need therapy again to help with this, but need to address shoulder pain/injury first -workup ordered due to declining status, but her decline may be due to her shoulder injury from  fall  2. Alzheimer's disease -progressing, had recent fall, cont aricept and namenda  3. Failure to thrive in adult -recently has been ill and had a fall with shoulder injury so has not been herself -will monitor weights carefully   4. DM (diabetes mellitus) type II controlled with renal manifestation -well controlled with diet alone  5. Chronic renal disease, stage 3, moderately decreased glomerular filtration rate (GFR) between 30-59 mL/min/1.73 square meter -due to #4;  Avoid nsaids, maintain adequate hydration  6. Hypertension, benign essential, goal below 140/90 -bp at goal for her age and functional status  7.  Left rotator cuff injury: -obtain xray of left shoulder to r/o dislocation -use tylenol that is available as needed--may need tramadol instead especially if she ends up getting therapy for this  Family/ staff Communication: discussed with dietitian assistant and nursing staff  Goals of care: long term care resident, DNR  Labs/tests ordered:  Cbc, cmp, hba1c, UA c+s, push po fluids

## 2014-01-24 LAB — BASIC METABOLIC PANEL
BUN: 29 mg/dL — AB (ref 4–21)
Creatinine: 1.6 mg/dL — AB (ref 0.5–1.1)
Glucose: 219 mg/dL
Potassium: 4.9 mmol/L (ref 3.4–5.3)
Sodium: 142 mmol/L (ref 137–147)

## 2014-01-24 LAB — CBC AND DIFFERENTIAL
HCT: 38 % (ref 36–46)
HEMOGLOBIN: 11.1 g/dL — AB (ref 12.0–16.0)
Platelets: 259 10*3/uL (ref 150–399)
WBC: 4.9 10*3/mL

## 2014-04-05 ENCOUNTER — Emergency Department (HOSPITAL_COMMUNITY)
Admission: EM | Admit: 2014-04-05 | Discharge: 2014-04-05 | Disposition: A | Discharge status: Home / Self Care | Payer: Medicare Other | Attending: Emergency Medicine | Admitting: Emergency Medicine

## 2014-04-05 ENCOUNTER — Encounter (HOSPITAL_COMMUNITY): Payer: Self-pay | Admitting: Emergency Medicine

## 2014-04-05 ENCOUNTER — Emergency Department (HOSPITAL_COMMUNITY): Payer: Medicare Other

## 2014-04-05 DIAGNOSIS — Z79899 Other long term (current) drug therapy: Secondary | ICD-10-CM | POA: Diagnosis not present

## 2014-04-05 DIAGNOSIS — S46909A Unspecified injury of unspecified muscle, fascia and tendon at shoulder and upper arm level, unspecified arm, initial encounter: Secondary | ICD-10-CM | POA: Insufficient documentation

## 2014-04-05 DIAGNOSIS — Y9389 Activity, other specified: Secondary | ICD-10-CM | POA: Insufficient documentation

## 2014-04-05 DIAGNOSIS — Y92009 Unspecified place in unspecified non-institutional (private) residence as the place of occurrence of the external cause: Secondary | ICD-10-CM

## 2014-04-05 DIAGNOSIS — Y929 Unspecified place or not applicable: Secondary | ICD-10-CM | POA: Insufficient documentation

## 2014-04-05 DIAGNOSIS — E785 Hyperlipidemia, unspecified: Secondary | ICD-10-CM | POA: Diagnosis not present

## 2014-04-05 DIAGNOSIS — S4980XA Other specified injuries of shoulder and upper arm, unspecified arm, initial encounter: Secondary | ICD-10-CM | POA: Insufficient documentation

## 2014-04-05 DIAGNOSIS — E119 Type 2 diabetes mellitus without complications: Secondary | ICD-10-CM | POA: Insufficient documentation

## 2014-04-05 DIAGNOSIS — W19XXXA Unspecified fall, initial encounter: Secondary | ICD-10-CM

## 2014-04-05 DIAGNOSIS — W050XXA Fall from non-moving wheelchair, initial encounter: Secondary | ICD-10-CM | POA: Diagnosis not present

## 2014-04-05 DIAGNOSIS — Z8739 Personal history of other diseases of the musculoskeletal system and connective tissue: Secondary | ICD-10-CM | POA: Diagnosis not present

## 2014-04-05 DIAGNOSIS — Z791 Long term (current) use of non-steroidal anti-inflammatories (NSAID): Secondary | ICD-10-CM | POA: Insufficient documentation

## 2014-04-05 DIAGNOSIS — I10 Essential (primary) hypertension: Secondary | ICD-10-CM | POA: Insufficient documentation

## 2014-04-05 LAB — URINALYSIS, ROUTINE W REFLEX MICROSCOPIC
Bilirubin Urine: NEGATIVE
GLUCOSE, UA: NEGATIVE mg/dL
Ketones, ur: NEGATIVE mg/dL
Nitrite: NEGATIVE
PROTEIN: NEGATIVE mg/dL
Specific Gravity, Urine: 1.011 (ref 1.005–1.030)
Urobilinogen, UA: 0.2 mg/dL (ref 0.0–1.0)
pH: 7 (ref 5.0–8.0)

## 2014-04-05 LAB — CBC WITH DIFFERENTIAL/PLATELET
Basophils Absolute: 0 10*3/uL (ref 0.0–0.1)
Basophils Relative: 0 % (ref 0–1)
EOS ABS: 0 10*3/uL (ref 0.0–0.7)
Eosinophils Relative: 0 % (ref 0–5)
HCT: 36.1 % (ref 36.0–46.0)
Hemoglobin: 11.2 g/dL — ABNORMAL LOW (ref 12.0–15.0)
Lymphocytes Relative: 25 % (ref 12–46)
Lymphs Abs: 1.5 10*3/uL (ref 0.7–4.0)
MCH: 26.2 pg (ref 26.0–34.0)
MCHC: 31 g/dL (ref 30.0–36.0)
MCV: 84.5 fL (ref 78.0–100.0)
Monocytes Absolute: 0.5 10*3/uL (ref 0.1–1.0)
Monocytes Relative: 8 % (ref 3–12)
Neutro Abs: 4.1 10*3/uL (ref 1.7–7.7)
Neutrophils Relative %: 67 % (ref 43–77)
PLATELETS: 212 10*3/uL (ref 150–400)
RBC: 4.27 MIL/uL (ref 3.87–5.11)
RDW: 15.5 % (ref 11.5–15.5)
WBC: 6.2 10*3/uL (ref 4.0–10.5)

## 2014-04-05 LAB — URINE MICROSCOPIC-ADD ON

## 2014-04-05 LAB — BASIC METABOLIC PANEL
Anion gap: 11 (ref 5–15)
BUN: 27 mg/dL — AB (ref 6–23)
CO2: 31 mEq/L (ref 19–32)
CREATININE: 1.65 mg/dL — AB (ref 0.50–1.10)
Calcium: 9.8 mg/dL (ref 8.4–10.5)
Chloride: 103 mEq/L (ref 96–112)
GFR calc Af Amer: 28 mL/min — ABNORMAL LOW (ref >90)
GFR, EST NON AFRICAN AMERICAN: 25 mL/min — AB (ref >90)
GLUCOSE: 125 mg/dL — AB (ref 70–99)
Potassium: 4.6 mEq/L (ref 3.7–5.3)
Sodium: 145 mEq/L (ref 137–147)

## 2014-04-05 LAB — HEMOGLOBIN A1C: Hgb A1c MFr Bld: 7.1 % — AB (ref 4.0–6.0)

## 2014-04-05 MED ORDER — CEPHALEXIN 500 MG PO CAPS: 500.0000 mg | ORAL_CAPSULE | Freq: Four times a day (QID) | ORAL | Status: DC

## 2014-04-05 NOTE — ED Notes (Signed)
PA at bedside.

## 2014-04-05 NOTE — Discharge Instructions (Signed)

## 2014-04-05 NOTE — ED Notes (Signed)
Attempted to call report x 2 to facility however was unsuccessful.  Will send d/c instructions w/ PTAR to SNF.

## 2014-04-05 NOTE — ED Provider Notes (Signed)
CSN: [id number]     Arrival date & time 04/05/14  1336 History   None    Chief Complaint  Patient presents with  . Fall  . Shoulder Pain    left     (Consider location/radiation/quality/duration/timing/severity/associated sxs/prior Treatment) HPI Vickie [name] is a 80 y.o. female with a history of dementia who comes in for evaluation after unwitnessed fall. History is given by her daughter who is with her now. Daughter reports around 2:00 she received a call from her mother's nursing home that they had found her in the floor of her bathroom. She reports she may have fallen during a transfer from her wheelchair to the toilet and may have hit her R shoulder.. She is typically independent and does not require transportation assistance. She denies loss of consciousness, nausea vomiting, she did not hit her head. She reports right shoulder pain.   Past Medical History  Diagnosis Date  . Diabetes mellitus type II, controlled   . Hyperlipidemia LDL goal < 100   . Gout   . Alzheimer's disease   . Hypertension, benign essential, goal below 140/90   . Senile osteoporosis   . Edema    History reviewed. No pertinent past surgical history. No family history on file. History  Substance Use Topics  . Smoking status: Unknown If Ever Smoked  . Smokeless tobacco: Not on file  . Alcohol Use: No   OB History   Grav Para Term Preterm Abortions TAB SAB Ect Mult Living                 Review of Systems  Constitutional: Negative for fever.  Respiratory: Negative for shortness of breath.   Cardiovascular: Negative for chest pain.  Musculoskeletal: Positive for arthralgias.  Skin: Negative for rash.      Allergies  Beta adrenergic blockers  Home Medications   Prior to Admission medications   Medication Sig Start Date End Date Taking? Authorizing Provider  allopurinol (ZYLOPRIM) 100 MG tablet Take 100 mg by mouth every other day.    Yes Historical Provider, MD  brimonidine-timolol  (COMBIGAN) 0.2-0.5 % ophthalmic solution Place 1 drop into both eyes at bedtime.   Yes Historical Provider, MD  calcitonin, salmon, (MIACALCIN/FORTICAL) 200 UNIT/ACT nasal spray Place 1 spray into alternate nostrils daily.   Yes Historical Provider, MD  calcium-vitamin D (OSCAL WITH D) 500-200 MG-UNIT per tablet Take 1 tablet by mouth 3 (three) times daily.   Yes Historical Provider, MD  carboxymethylcellulose (REFRESH PLUS) 0.5 % SOLN Place 1 drop into both eyes 4 (four) times daily.   Yes Historical Provider, MD  diclofenac sodium (VOLTAREN) 1 % GEL Apply 2 g topically 4 (four) times daily.   Yes Historical Provider, MD  donepezil (ARICEPT) 10 MG tablet Take 10 mg by mouth at bedtime.   Yes Historical Provider, MD  furosemide (LASIX) 20 MG tablet Take 20 mg by mouth daily with breakfast.    Yes Historical Provider, MD  lisinopril (PRINIVIL,ZESTRIL) 2.5 MG tablet Take 5 mg by mouth daily with breakfast.    Yes Historical Provider, MD  Memantine HCl ER (NAMENDA XR) 28 MG CP24 Take 28 mg by mouth every morning.    Yes Historical Provider, MD  Multiple Vitamins-Iron (MULTIVITAMINS WITH IRON) TABS tablet Take 1 tablet by mouth every morning.    Yes Historical Provider, MD  Potassium Chloride ER 20 MEQ TBCR Take 20 mEq by mouth every morning.    Yes Historical Provider, MD  pravastatin (PRAVACHOL) 20 MG  tablet Take 20 mg by mouth at bedtime.    Yes Historical Provider, MD   BP 176/64  Pulse 87  Temp(Src) 98.1 F (36.7 C) (Oral)  Resp 16  SpO2 94% Physical Exam  Nursing note and vitals reviewed. Constitutional:  Awake, alert, nontoxic appearance with baseline speech for patient.  HENT:  Head: Atraumatic.  Mouth/Throat: No oropharyngeal exudate.  Eyes: EOM are normal. Pupils are equal, round, and reactive to light. Right eye exhibits no discharge. Left eye exhibits no discharge.  Neck: Neck supple.  Cardiovascular: Normal rate and regular rhythm.   No murmur heard. Pulmonary/Chest: Effort  normal and breath sounds normal. No stridor. No respiratory distress. She has no wheezes. She has no rales. She exhibits no tenderness.  Abdominal: Soft. Bowel sounds are normal. She exhibits no mass. There is no tenderness. There is no rebound.  Musculoskeletal: She exhibits no tenderness.  Baseline ROM, moves extremities with no obvious new focal weakness.  Full active ROM R shoulder compared with left. No obvious lesions or deformities. No spine tenderness.  Lymphadenopathy:    She has no cervical adenopathy.  Neurological:  Awake, alert, cooperative and aware of situation; motor strength bilaterally; sensation normal to light touch bilaterally; peripheral visual fields full to confrontation; no facial asymmetry; tongue midline; major cranial nerves appear intact  Skin: No rash noted.  Psychiatric: She has a normal mood and affect.    ED Course  Procedures (including critical care time) Labs Review Labs Reviewed  BASIC METABOLIC PANEL - Abnormal; Notable for the following:    Glucose, Bld 125 (*)    BUN 27 (*)    Creatinine, Ser 1.65 (*)    GFR calc non Af Amer 25 (*)    GFR calc Af Amer 28 (*)    All other components within normal limits  CBC WITH DIFFERENTIAL - Abnormal; Notable for the following:    Hemoglobin 11.2 (*)    All other components within normal limits  URINALYSIS, ROUTINE W REFLEX MICROSCOPIC - Abnormal; Notable for the following:    APPearance CLOUDY (*)    Hgb urine dipstick TRACE (*)    Leukocytes, UA TRACE (*)    All other components within normal limits  URINE MICROSCOPIC-ADD ON - Abnormal; Notable for the following:    Bacteria, UA MANY (*)    All other components within normal limits    Imaging Review Dg Shoulder Right  04/05/2014   CLINICAL DATA:  Pain post trauma  EXAM: RIGHT SHOULDER - 2+ VIEW  COMPARISON:  June 28, 2010  FINDINGS: Frontal, Y scapular, and axillary images were obtained. On the axillary image, there is evidence of a Hill-Sachs  defect. There is calcification in this area which appears well corticated there is no demonstrable acute fracture or dislocation. There is moderate generalized osteoarthritic change.  IMPRESSION: No demonstrable acute fracture or dislocation. There is moderate osteoarthritic change. There is a Hill-Sachs defect, suggesting prior anterior shoulder dislocation.   Electronically Signed   By: Bretta Bang M.D.   On: 04/05/2014 16:28     EKG Interpretation   Date/Time:  Friday April 05 2014 16:39:25 EDT Ventricular Rate:  55 PR Interval:  245 QRS Duration: 78 QT Interval:  486 QTC Calculation: 465 R Axis:   -7 Text Interpretation:  Sinus rhythm Prolonged PR interval Left ventricular  hypertrophy Borderline T abnormalities, lateral leads Baseline wander in  lead(s) I II aVR aVL No significant change since last tracing Confirmed by  Freida Busman  MD, ANTHONY (  [id number]) on 04/05/2014 4:46:21 PM     Meds given in ED:  Medications - No data to display  New Prescriptions   No medications on file   Filed Vitals:   04/05/14 1334  BP: 176/64  Pulse: 87  Temp: 98.1 F (36.7 C)  TempSrc: Oral  Resp: 16  SpO2: 94%    MDM  Vitals stable - WNL -afebrile Pt resting comfortably in ED without any discomfort. Pt is at baseline per family.  PE shows no obvious deformities, ROM baseline for pt. Labwork noncontributory, Urinalysis shows potential UTI, will treat empirically with Keflex. Imaging shows no frx or dislocation of R shoulder. Reason for fall likely mechanical. I do not believe there is an underlying emergent pathology that resulted in the fall. Pt is stable and in good condition and is suitable for discharge. Continue NSAIDS at home for pain management. Discussed f/u with PCP and return precautions, pt very amenable to plan.   Final diagnoses:  Fall at home, initial encounter  Prior to patient discharge, I discussed and reviewed this case with Dr.Allen         Sharlene Motts, PA-C 04/06/14 1226

## 2014-04-05 NOTE — ED Notes (Signed)
Patient transported to X-ray 

## 2014-04-05 NOTE — ED Notes (Signed)
Per EMS pt comes from [facility] for un witnessed fall when transferring from toilet back to wheelchair. Pt at her baseline per EMS. Pt did c/o left shoulder pain.

## 2014-04-05 NOTE — ED Provider Notes (Signed)
Medical screening examination/treatment/procedure(s) were conducted as a shared visit with non-physician practitioner(s) and myself.  I personally evaluated the patient during the encounter.   EKG Interpretation   Date/Time:  Friday April 05 2014 16:39:25 EDT Ventricular Rate:  55 PR Interval:  245 QRS Duration: 78 QT Interval:  486 QTC Calculation: 465 R Axis:   -7 Text Interpretation:  Sinus rhythm Prolonged PR interval Left ventricular  hypertrophy Borderline T abnormalities, lateral leads Baseline wander in  lead(s) I II aVR aVL No significant change since last tracing Confirmed by  Jeshurun Oaxaca  MD, Belia Febo ([id number]) on 04/05/2014 4:46:21 PM     Patient here after having an unwitnessed fall the nursing home. Complains of right shoulder pain. Physical exam shows no gross deformities. Shoulder x-ray negative. We'll check routine labs and likely discharge him. Patient had baseline according to her relative   Toy Baker, MD 04/05/14 [phone]

## 2014-04-05 NOTE — ED Notes (Signed)
Bed: [facility] Expected date:  Expected time:  Means of arrival:  Comments: EMS fall 

## 2014-04-06 NOTE — ED Provider Notes (Signed)
Medical screening examination/treatment/procedure(s) were conducted as a shared visit with non-physician practitioner(s) and myself.  I personally evaluated the patient during the encounter  Toy Baker, MD 04/06/14 [phone]

## 2014-04-17 ENCOUNTER — Non-Acute Institutional Stay (SKILLED_NURSING_FACILITY): Payer: Medicare Other | Admitting: Internal Medicine

## 2014-04-17 DIAGNOSIS — R531 Weakness: Secondary | ICD-10-CM

## 2014-04-17 DIAGNOSIS — R5381 Other malaise: Secondary | ICD-10-CM

## 2014-04-17 DIAGNOSIS — F0280 Dementia in other diseases classified elsewhere without behavioral disturbance: Secondary | ICD-10-CM

## 2014-04-17 DIAGNOSIS — R627 Adult failure to thrive: Secondary | ICD-10-CM

## 2014-04-17 DIAGNOSIS — S46002D Unspecified injury of muscle(s) and tendon(s) of the rotator cuff of left shoulder, subsequent encounter: Secondary | ICD-10-CM

## 2014-04-17 DIAGNOSIS — S4980XA Other specified injuries of shoulder and upper arm, unspecified arm, initial encounter: Secondary | ICD-10-CM

## 2014-04-17 DIAGNOSIS — Z5189 Encounter for other specified aftercare: Secondary | ICD-10-CM

## 2014-04-17 DIAGNOSIS — E1129 Type 2 diabetes mellitus with other diabetic kidney complication: Secondary | ICD-10-CM

## 2014-04-17 DIAGNOSIS — S46909A Unspecified injury of unspecified muscle, fascia and tendon at shoulder and upper arm level, unspecified arm, initial encounter: Secondary | ICD-10-CM

## 2014-04-17 DIAGNOSIS — I10 Essential (primary) hypertension: Secondary | ICD-10-CM

## 2014-04-17 DIAGNOSIS — G309 Alzheimer's disease, unspecified: Principal | ICD-10-CM

## 2014-04-17 DIAGNOSIS — R5383 Other fatigue: Secondary | ICD-10-CM

## 2014-04-17 NOTE — Progress Notes (Signed)
Patient ID: Vickie [name], female   DOB: April 12, 1913, 80 y.o.   MRN: [id number]  Location:  [facility] Provider:  Rexene Edison. Mariea Clonts, D.O., C.M.D.  Code Status:  DNR  Chief Complaint  Patient presents with  . Acute Visit    family meeting to discuss decline; recent fall 9/18 with ED visit    HPI:  80 yo black female long term care resident with dementia, arthritis, gout, htn, hyperlipidemia seen today due to generalized decline associated with her dementia.  She has been sleeping more, calling her family in the middle of the night.  She has an ulcer of the left heel (being txed with skin prep and prevelon boot) which is new.  Her niece thinks she'd benefit from either a nap in the afternoon or a wheelchair that allows her to rest her head back comfortably when she nods off.  She continues to go to bingo, coffee talk and participate in these activities.  She has lost weight but not dramatically (5 lbs in the past year).  She has been getting physical therapy due to her falls.  Review of Systems:  Review of Systems  Constitutional: Positive for weight loss and malaise/fatigue. Negative for fever.  Eyes: Negative for blurred vision.  Respiratory: Negative for shortness of breath.   Cardiovascular: Negative for chest pain.  Gastrointestinal: Negative for abdominal pain, diarrhea, constipation, blood in stool and melena.  Genitourinary: Negative for dysuria.  Musculoskeletal: Positive for joint pain.       Shoulder  Skin: Negative for rash.  Neurological: Positive for weakness. Negative for dizziness and headaches.  Psychiatric/Behavioral: Positive for memory loss. Negative for depression.    Medications: Patient's Medications  New Prescriptions   No medications on file  Previous Medications   ALLOPURINOL (ZYLOPRIM) 100 MG TABLET    Take 100 mg by mouth every other day.    BRIMONIDINE-TIMOLOL (COMBIGAN) 0.2-0.5 % OPHTHALMIC SOLUTION    Place 1 drop into both eyes at  bedtime.   CALCITONIN, SALMON, (MIACALCIN/FORTICAL) 200 UNIT/ACT NASAL SPRAY    Place 1 spray into alternate nostrils daily.   CALCIUM-VITAMIN D (OSCAL WITH D) 500-200 MG-UNIT PER TABLET    Take 1 tablet by mouth 3 (three) times daily.   CARBOXYMETHYLCELLULOSE (REFRESH PLUS) 0.5 % SOLN    Place 1 drop into both eyes 4 (four) times daily.   CEPHALEXIN (KEFLEX) 500 MG CAPSULE    Take 1 capsule (500 mg total) by mouth 4 (four) times daily.   DICLOFENAC SODIUM (VOLTAREN) 1 % GEL    Apply 2 g topically 4 (four) times daily.   DONEPEZIL (ARICEPT) 10 MG TABLET    Take 10 mg by mouth at bedtime.   FUROSEMIDE (LASIX) 20 MG TABLET    Take 20 mg by mouth daily with breakfast.    LISINOPRIL (PRINIVIL,ZESTRIL) 2.5 MG TABLET    Take 5 mg by mouth daily with breakfast.    MEMANTINE HCL ER (NAMENDA XR) 28 MG CP24    Take 28 mg by mouth every morning.    MULTIPLE VITAMINS-IRON (MULTIVITAMINS WITH IRON) TABS TABLET    Take 1 tablet by mouth every morning.    POTASSIUM CHLORIDE ER 20 MEQ TBCR    Take 20 mEq by mouth every morning.    PRAVASTATIN (PRAVACHOL) 20 MG TABLET    Take 20 mg by mouth at bedtime.   Modified Medications   No medications on file  Discontinued Medications   No medications on file  Physical Exam: Physical Exam  Constitutional: She is oriented to person, place, and time. She appears well-developed and well-nourished. No distress.  Cardiovascular: Normal rate, regular rhythm, normal heart sounds and intact distal pulses.   Pulmonary/Chest: Effort normal and breath sounds normal. No respiratory distress.  Abdominal: Soft. Bowel sounds are normal. She exhibits no distension and no mass. There is no tenderness.  Musculoskeletal: She exhibits tenderness.  Neurological: She is alert and oriented to person, place, and time.  Skin: Skin is warm and dry.  Psychiatric: She has a normal mood and affect.   Labs reviewed: Basic Metabolic Panel:  Recent Labs  04/05/14 1637  NA 145  K 4.6    CL 103  CO2 31  GLUCOSE 125*  BUN 27*  CREATININE 1.65*  CALCIUM 9.8    CBC:  Recent Labs  04/05/14 1637  WBC 6.2  NEUTROABS 4.1  HGB 11.2*  HCT 36.1  MCV 84.5  PLT 212   Assessment/Plan 1. Alzheimer's disease -is progressing, met with her niece who is her hcpoa today and we completed her MOST form--this will need to be scanned into epic -cont namenda xr, aricept which could be changed over to namzaric -her goals are quality of life not prolonging life -her niece requests that we treat minor infections and problems but she does not want invasive testing or workups -she would still like her to be treated for reversible causes 2. Generalized weakness -is doing therapy now -due to progression of dementia -has had labs that have been normal 3. Failure to thrive in adult -due to dementia progression -continues to eat well, just gradually losing weight -if weight loss becomes more problematic, will stop aricept 4. DM (diabetes mellitus) type II controlled with renal manifestation -has been well controlled on diet only 5. Injury of left rotator cuff, subsequent encounter -improved with voltaren use and therapy -interfering some with pushing wheelchair and doing transfers 6. Benign hypertension -bp at goal, no changes  Family/ staff Communication: care plan meeting held with myself, social work and pt's niece, discussed with nursing, as well  Goals of care:  Long term care resident, quality of life;  MOST form done  Labs/tests ordered:  Has already had cbc, cmp, ua c+s    

## 2014-04-19 ENCOUNTER — Encounter: Payer: Self-pay | Admitting: Internal Medicine

## 2014-05-14 ENCOUNTER — Non-Acute Institutional Stay (SKILLED_NURSING_FACILITY): Payer: Medicare Other | Admitting: Internal Medicine

## 2014-05-14 DIAGNOSIS — G300 Alzheimer's disease with early onset: Secondary | ICD-10-CM

## 2014-05-14 DIAGNOSIS — W19XXXA Unspecified fall, initial encounter: Secondary | ICD-10-CM

## 2014-05-14 DIAGNOSIS — M810 Age-related osteoporosis without current pathological fracture: Secondary | ICD-10-CM

## 2014-05-14 NOTE — Progress Notes (Signed)
MRN: [id number] Name: Vickie [name]  Sex: female Age: 80 y.o. DOB: [birth year]  [facility]233A Level Of Care: SNF Provider: Merrilee SeashoreALEXANDER, Darek Eifler D Emergency Contacts: Extended Emergency Contact Information Primary Emergency Contact: Smith,Roslyn Address: [address]          [place], [place] [address] [place] Home Phone: [phone] Mobile Phone: [phone] Relation: Niece     Allergies: Beta adrenergic blockers  Chief Complaint  Patient presents with  . Acute Visit    HPI: Patient is 80 y.o. female who fell in her room, [facility] but whose daughter demands pt be evaluated.  Past Medical History  Diagnosis Date  . Diabetes mellitus type II, controlled   . Hyperlipidemia LDL goal < 100   . Gout   . Alzheimer's disease   . Hypertension, benign essential, goal below 140/90   . Senile osteoporosis   . Edema     History reviewed. No pertinent past surgical history.    Medication List       This list is accurate as of: 05/14/14 11:59 PM.  Always use your most recent med list.               allopurinol 100 MG tablet  Commonly known as:  ZYLOPRIM  Take 100 mg by mouth every other day.     calcitonin (salmon) 200 UNIT/ACT nasal spray  Commonly known as:  MIACALCIN/FORTICAL  Place 1 spray into alternate nostrils daily.     calcium-vitamin D 500-200 MG-UNIT per tablet  Commonly known as:  OSCAL WITH D  Take 1 tablet by mouth 3 (three) times daily.     carboxymethylcellulose 0.5 % Soln  Commonly known as:  REFRESH PLUS  Place 1 drop into both eyes 4 (four) times daily.     cephALEXin 500 MG capsule  Commonly known as:  KEFLEX  Take 1 capsule (500 mg total) by mouth 4 (four) times daily.     COMBIGAN 0.2-0.5 % ophthalmic solution  Generic drug:  brimonidine-timolol  Place 1 drop into both eyes at bedtime.     diclofenac sodium 1 % Gel  Commonly known as:  VOLTAREN  Apply 2 g topically 4  (four) times daily.     donepezil 10 MG tablet  Commonly known as:  ARICEPT  Take 10 mg by mouth at bedtime.     furosemide 20 MG tablet  Commonly known as:  LASIX  Take 20 mg by mouth daily with breakfast.     lisinopril 2.5 MG tablet  Commonly known as:  PRINIVIL,ZESTRIL  Take 5 mg by mouth daily with breakfast.     multivitamins with iron Tabs tablet  Take 1 tablet by mouth every morning.     NAMENDA XR 28 MG Cp24  Generic drug:  Memantine HCl ER  Take 28 mg by mouth every morning.     Potassium Chloride ER 20 MEQ Tbcr  Take 20 mEq by mouth every morning.     pravastatin 20 MG tablet  Commonly known as:  PRAVACHOL  Take 20 mg by mouth at bedtime.        No orders of the defined types were placed in this encounter.    Immunization History  Administered Date(s) Administered  . Influenza Whole 04/30/2013  . Influenza-Unspecified 05/01/2014  . Pneumococcal-Unspecified 05/19/2006    History  Substance Use Topics  . Smoking status: Unknown If Ever Smoked  . Smokeless tobacco: Not on file  .  Alcohol Use: No    Review of Systems  DATA OBTAINED: from patient, nurse; pt says she is fine, nothing hurts GENERAL:  no fevers, fatigue, appetite changes SKIN: No itching, rash HEENT: No complaint RESPIRATORY: No cough, wheezing, SOB CARDIAC: No chest pain, palpitations, lower extremity edema  GI: No abdominal pain, No N/V/D or constipation, No heartburn or reflux  GU: No dysuria, frequency or urgency, or incontinence  MUSCULOSKELETAL: No unrelieved bone/joint pain NEUROLOGIC: No headache, dizziness  PSYCHIATRIC: No overt anxiety or sadness  Filed Vitals:   05/14/14 2152  BP: 155/72  Pulse: 66  Temp: 98.4 F (36.9 C)  Resp: 18    Physical Exam  GENERAL APPEARANCE: Alert, conversant, No acute distress, small BF sitting in her WC reading the paper.  SKIN: No diaphoresis rash, or wounds or bruising HEENT: Unremarkable AT RESPIRATORY: Breathing is even,  unlabored. Lung sounds are clear   CARDIOVASCULAR: Heart RRR no murmurs, rubs or gallops. No peripheral edema  GASTROINTESTINAL: Abdomen is soft, non-tender, not distended w/ normal bowel sounds.  GENITOURINARY: Bladder non tender, not distended  MUSCULOSKELETAL: No abnormal joints or musculature; all bones palpated which was easy because pt is thin-no swelling, no pain NEUROLOGIC: Cranial nerves 2-12 grossly intact. Moves all extremities PSYCHIATRIC: Mood and affect appropriate to situation, no behavioral issues  Patient Active Problem List   Diagnosis Date Noted  . Chronic renal disease, stage 3, moderately decreased glomerular filtration rate (GFR) between 30-59 mL/min/1.73 square meter 06/10/2013  . Anemia in chronic kidney disease 06/10/2013  . DM (diabetes mellitus) type II controlled with renal manifestation   . Hyperlipidemia LDL goal < 100   . Gout   . Alzheimer's disease   . Hypertension, benign essential, goal below 140/90   . Senile osteoporosis   . Edema     CBC    Component Value Date/Time   WBC 6.2 04/05/2014 1637   RBC 4.27 04/05/2014 1637   HGB 11.2* 04/05/2014 1637   HCT 36.1 04/05/2014 1637   PLT 212 04/05/2014 1637   MCV 84.5 04/05/2014 1637   LYMPHSABS 1.5 04/05/2014 1637   MONOABS 0.5 04/05/2014 1637   EOSABS 0.0 04/05/2014 1637   BASOSABS 0.0 04/05/2014 1637    CMP     Component Value Date/Time   NA 145 04/05/2014 1637   K 4.6 04/05/2014 1637   CL 103 04/05/2014 1637   CO2 31 04/05/2014 1637   GLUCOSE 125* 04/05/2014 1637   BUN 27* 04/05/2014 1637   CREATININE 1.65* 04/05/2014 1637   CALCIUM 9.8 04/05/2014 1637   GFRNONAA 25* 04/05/2014 1637   GFRAA 28* 04/05/2014 1637    Assessment and Plan  FALL - Pt is her usual very functional self for age 89 without signs or sx of injury. No sign or sx of impending infection that may have caused a fall.Will continue to monitor  Margit HanksALEXANDER, Italia Wolfert D, MD

## 2014-05-20 ENCOUNTER — Encounter: Payer: Self-pay | Admitting: Internal Medicine

## 2014-05-25 ENCOUNTER — Encounter: Payer: Self-pay | Admitting: Internal Medicine

## 2014-05-29 ENCOUNTER — Non-Acute Institutional Stay (SKILLED_NURSING_FACILITY): Payer: Medicare Other | Admitting: Internal Medicine

## 2014-05-29 ENCOUNTER — Encounter: Payer: Self-pay | Admitting: Internal Medicine

## 2014-05-29 DIAGNOSIS — G309 Alzheimer's disease, unspecified: Secondary | ICD-10-CM

## 2014-05-29 DIAGNOSIS — E785 Hyperlipidemia, unspecified: Secondary | ICD-10-CM

## 2014-05-29 DIAGNOSIS — H409 Unspecified glaucoma: Secondary | ICD-10-CM

## 2014-05-29 DIAGNOSIS — E119 Type 2 diabetes mellitus without complications: Secondary | ICD-10-CM

## 2014-05-29 DIAGNOSIS — I10 Essential (primary) hypertension: Secondary | ICD-10-CM

## 2014-05-29 DIAGNOSIS — F0280 Dementia in other diseases classified elsewhere without behavioral disturbance: Secondary | ICD-10-CM

## 2014-05-29 NOTE — Progress Notes (Signed)
Patient ID: Vickie SirenAlyce [name], female   DOB: 1913-06-21, 80 y.o.   MRN: [id number]   [facility]  Allergies  Allergen Reactions  . Beta Adrenergic Blockers     Because of first degree AV block    Code Status: DNR  Goals of Care: Comfort and Quality of Life/long term care  Chief Complaint  Patient presents with  . Medical Management of Chronic Issues    DM2, AD, glaucoma, HTN, HLD    HPI 80 y.o. female with PMH of DM2, AD, HTN, HLD, glaucoma among others is being seen for a routine visit. Weight stable. No change in functional status or behaviors reported. No recent falls or skin concerns reported. No concerns from nursing staff. No concerns verbalized from patient.   Review of Systems Constitutional: Negative for fever and chills HENT: Negative for ear pain, congestion, and sore throat Eyes: Negative for eye pain, eye discharge, and visual disturbance  Cardiovascular: Negative for chest pain, palpitations, and leg swelling Respiratory: Negative cough, shortness of breath, and wheezing.  Gastrointestinal: Negative for nausea and vomiting. Negative for abdominal pain, diarrhea and constipation.  Musculoskeletal: Negative for back pain, joint pain, and joint swelling  Neurological: Negative for dizziness and headache Skin: Negative for rash and wound.   Psychiatric: Negative for depression  Past Medical History  Diagnosis Date  . Diabetes mellitus type II, controlled   . Hyperlipidemia LDL goal < 100   . Gout   . Alzheimer's disease   . Hypertension, benign essential, goal below 140/90   . Senile osteoporosis   . Edema     No past surgical history on file.  History   Social History  . Marital Status: Widowed    Spouse Name: N/A    Number of Children: N/A  . Years of Education: N/A   Occupational History  . Not on file.   Social History Main Topics  . Smoking status: Unknown If Ever Smoked  . Smokeless tobacco: Not on file  .  Alcohol Use: No  . Drug Use: No  . Sexual Activity: Not on file   Other Topics Concern  . Not on file   Social History Narrative      Medication List       This list is accurate as of: 05/29/14 12:41 PM.  Always use your most recent med list.               allopurinol 100 MG tablet  Commonly known as:  ZYLOPRIM  Take 100 mg by mouth every other day.     calcitonin (salmon) 200 UNIT/ACT nasal spray  Commonly known as:  MIACALCIN/FORTICAL  Place 1 spray into alternate nostrils daily.     calcium-vitamin D 500-200 MG-UNIT per tablet  Commonly known as:  OSCAL WITH D  Take 1 tablet by mouth 3 (three) times daily.     carboxymethylcellulose 0.5 % Soln  Commonly known as:  REFRESH PLUS  Place 1 drop into both eyes 4 (four) times daily.     COMBIGAN 0.2-0.5 % ophthalmic solution  Generic drug:  brimonidine-timolol  Place 1 drop into both eyes at bedtime.     diclofenac sodium 1 % Gel  Commonly known as:  VOLTAREN  Apply 2 g topically 4 (four) times daily.     donepezil 10 MG tablet  Commonly known as:  ARICEPT  Take 10 mg by mouth at bedtime.     furosemide 20 MG tablet  Commonly known as:  LASIX  Take 20 mg by mouth daily with breakfast.     lisinopril 2.5 MG tablet  Commonly known as:  PRINIVIL,ZESTRIL  Take 5 mg by mouth daily with breakfast.     multivitamins with iron Tabs tablet  Take 1 tablet by mouth every morning.     NAMENDA XR 28 MG Cp24  Generic drug:  Memantine HCl ER  Take 28 mg by mouth every morning.     Potassium Chloride ER 20 MEQ Tbcr  Take 20 mEq by mouth every morning.     pravastatin 20 MG tablet  Commonly known as:  PRAVACHOL  Take 20 mg by mouth at bedtime.        Physical Exam Filed Vitals:   05/29/14 1226  BP: 140/90  Pulse: 56  Temp: 97.9 F (36.6 C)  Resp: 20   Constitutional: WDWN elderly female in no acute distress. conversant HEENT: Normocephalic and atraumatic. PERRL. EOM intact. No icterus. Oral mucosa moist.  Posterior pharynx clear of any exudate or lesions. Eyeglasses in place Neck: Supple and nontender. No lymphadenopathy, masses, or thyromegaly. No JVD or carotid bruits. Cardiac: Normal S1, S2. RRR without appreciable murmurs, rubs, or gallops. Distal pulses intact. No dependent edema.  Lungs: No respiratory distress. Breath sounds clear bilaterally without rales, rhonchi, or wheezes. Abdomen: Audible bowel sounds in all quadrants. Soft, nontender, nondistended. No palpable mass.  Musculoskeletal: Able to move all extremities. No joint erythema or tenderness. Skin: Warm and dry. No rash noted. No erythema.  Neurological: Alert and oriented to person Psychiatric:  Appropriate mood and affect.   Labs Reviewed CBC Latest Ref Rng 04/05/2014 01/24/2014  WBC 4.0 - 10.5 K/uL 6.2 4.9  Hemoglobin 12.0 - 15.0 g/dL 11.2(L) 11.1(A)  Hematocrit 36.0 - 46.0 % 36.1 38  Platelets 150 - 400 K/uL 212 259    CMP     Component Value Date/Time   NA 145 04/05/2014 1637   NA 142 01/24/2014   K 4.6 04/05/2014 1637   CL 103 04/05/2014 1637   CO2 31 04/05/2014 1637   GLUCOSE 125* 04/05/2014 1637   BUN 27* 04/05/2014 1637   BUN 29* 01/24/2014   CREATININE 1.65* 04/05/2014 1637   CREATININE 1.6* 01/24/2014   CALCIUM 9.8 04/05/2014 1637   GFRNONAA 25* 04/05/2014 1637   GFRAA 28* 04/05/2014 1637    Lab Results  Component Value Date   HGBA1C 7.1* 04/05/2014   Assessment & Plan  1. Alzheimer's disease Stable. Continue aricept 10mg  daily and Namenda xr 28mg  dail. Continue to monitor for change in behaviors. Continue fall and pressure ulcer prophylaxis.   2. Benign hypertension Stable. Continue lisinopril 5mg  daily and lasix 20mg  daily. Continue to monitor.   3. Diabetes mellitus type II, controlled Diet controlled. Continue  Con Cho NSP diet. Continue ACEI and statin.   4. Glaucoma Stable. Continue combigan 0.2-0.5% in both eyes at bedtime   5. HLD (hyperlipidemia) No recent lipid panel. Continue  pravachol for now and monitor.   6. Encounter for diabetic foot exam See documentation in  health maintenance/quality metrics section.    Labs Ordered: lipid panel.   Family/Staff Communication Plan of care discuss with nursing staff. Nursing staff verbalize understanding and agree with plan of care. No additional questions or concerns reported.     Loura BackKim Anahi Belmar, MSN, AGNP-C [facility] [address] [place], [place] [address] [phone] [8am-5pm] After hours: [phone]

## 2014-06-18 ENCOUNTER — Non-Acute Institutional Stay (SKILLED_NURSING_FACILITY): Payer: Medicare Other | Admitting: Adult Health

## 2014-06-18 DIAGNOSIS — M810 Age-related osteoporosis without current pathological fracture: Secondary | ICD-10-CM

## 2014-06-18 DIAGNOSIS — I10 Essential (primary) hypertension: Secondary | ICD-10-CM

## 2014-06-18 DIAGNOSIS — E1129 Type 2 diabetes mellitus with other diabetic kidney complication: Secondary | ICD-10-CM

## 2014-06-18 DIAGNOSIS — E785 Hyperlipidemia, unspecified: Secondary | ICD-10-CM

## 2014-06-18 DIAGNOSIS — M1A9XX0 Chronic gout, unspecified, without tophus (tophi): Secondary | ICD-10-CM

## 2014-06-18 DIAGNOSIS — R609 Edema, unspecified: Secondary | ICD-10-CM

## 2014-06-18 DIAGNOSIS — G309 Alzheimer's disease, unspecified: Secondary | ICD-10-CM

## 2014-06-18 DIAGNOSIS — N058 Unspecified nephritic syndrome with other morphologic changes: Secondary | ICD-10-CM

## 2014-06-18 DIAGNOSIS — F0280 Dementia in other diseases classified elsewhere without behavioral disturbance: Secondary | ICD-10-CM

## 2014-06-19 ENCOUNTER — Encounter: Payer: Self-pay | Admitting: Adult Health

## 2014-06-19 DIAGNOSIS — M1A9XX0 Chronic gout, unspecified, without tophus (tophi): Secondary | ICD-10-CM | POA: Insufficient documentation

## 2014-06-19 NOTE — Progress Notes (Signed)
Patient ID: Vickie [name], female   DOB: 1912/08/10, 80 y.o.   MRN: [id number]  [facility]     Allergies  Allergen Reactions  . Beta Adrenergic Blockers     Because of first degree AV block       Chief Complaint  Patient presents with  . Medical Management of Chronic Issues    HPI:  She is a long term care resident of this facility being seen for the management of her chronic illnesses. Overall there is little recent change in her status. She is unable to fully participate in the hpi or ros. There are no nursing concerns being voiced at this time.    Past Medical History  Diagnosis Date  . Diabetes mellitus type II, controlled   . Hyperlipidemia LDL goal < 100   . Gout   . Alzheimer's disease   . Hypertension, benign essential, goal below 140/90   . Senile osteoporosis   . Edema     No past surgical history on file.  VITAL SIGNS BP 137/88 mmHg  Pulse 68  Ht 5\' 1"  (1.549 m)  Wt 130 lb (58.968 kg)  BMI 24.58 kg/m2  SpO2 98%   Outpatient Encounter Prescriptions as of 06/18/2014  Medication Sig  . allopurinol (ZYLOPRIM) 100 MG tablet Take 100 mg by mouth every other day.   . brimonidine-timolol (COMBIGAN) 0.2-0.5 % ophthalmic solution Place 1 drop into both eyes at bedtime.  . calcitonin, salmon, (MIACALCIN/FORTICAL) 200 UNIT/ACT nasal spray Place 1 spray into alternate nostrils daily.  . calcium-vitamin D (OSCAL WITH D) 500-200 MG-UNIT per tablet Take 1 tablet by mouth 3 (three) times daily.  . carboxymethylcellulose (REFRESH PLUS) 0.5 % SOLN Place 1 drop into both eyes 4 (four) times daily.  . diclofenac sodium (VOLTAREN) 1 % GEL Apply 2 g topically 4 (four) times daily.  Marland Kitchen. donepezil (ARICEPT) 10 MG tablet Take 10 mg by mouth at bedtime.  . furosemide (LASIX) 20 MG tablet Take 20 mg by mouth daily with breakfast.   . lisinopril (PRINIVIL,ZESTRIL) 2.5 MG tablet Take 5 mg by mouth daily with breakfast.   . Memantine HCl ER (NAMENDA XR) 28 MG CP24 Take 28 mg by  mouth every morning.   . Multiple Vitamins-Iron (MULTIVITAMINS WITH IRON) TABS tablet Take 1 tablet by mouth every morning.   . Potassium Chloride ER 20 MEQ TBCR Take 20 mEq by mouth every morning.   . pravastatin (PRAVACHOL) 20 MG tablet Take 20 mg by mouth at bedtime.      SIGNIFICANT DIAGNOSTIC EXAMS  04-01-14: ABI: moderate PAD    LABS REVIEWED:   01-24-14: wbc 4.9; hgb 11.1; hct 37.5; mcv 86.8; plt 259; glucose 219; bun 28.6; creat 1.64; k+4.9; na++142; liver normal albumin 3.6 04-05-14: hgb a1c 7.1; pre-albumin 27      Review of Systems  Unable to perform ROS    Physical Exam  Constitutional: No distress.  thin  Neck: Neck supple. No JVD present. No thyromegaly present.  Cardiovascular: Normal rate, regular rhythm and intact distal pulses.   Respiratory: Effort normal and breath sounds normal. No respiratory distress.  GI: Soft. Bowel sounds are normal. She exhibits no distension. There is no tenderness.  Musculoskeletal: She exhibits no edema.  Is able to move all extremities   Neurological: She is alert.  Skin: Skin is warm and dry. She is not diaphoretic.     ASSESSMENT/ PLAN:  1. Gout: no recent flares present will continue allopurinol 100 mg every other day  2. Dyslipidemia: will continue pravachol 20 mg daily   3. Alzheimer's disease: without significant change in status; will continue aricept 10 mg daily and namenda xr 28 mg daily   4. Hypertension: is stable will continue lisinopril 5 mg daily   5. Osteoporosis: will continue miacalcin spray daily and ca++500/200 three times daily   6. Edema: is stable will continue lasix 20 mg daily with k+ 20 meq daily   7. Glaucoma: will continue continue combigan to both eyes nightly   8. Diabetes: her hgb a1c is 7.1; is presently not on medications; will not make changes and will monitor   9. Osteoarthritis: will continue voltaren gel 2 gm to left shoulder four times daily      Synthia Innocenteborah Green NP [facility]  Contact [phone] Monday through Friday 8am- 5pm  After hours call [phone]

## 2014-07-01 ENCOUNTER — Non-Acute Institutional Stay (SKILLED_NURSING_FACILITY): Payer: Medicare Other | Admitting: Adult Health

## 2014-07-01 DIAGNOSIS — F0280 Dementia in other diseases classified elsewhere without behavioral disturbance: Secondary | ICD-10-CM

## 2014-07-01 DIAGNOSIS — R627 Adult failure to thrive: Secondary | ICD-10-CM

## 2014-07-01 DIAGNOSIS — R634 Abnormal weight loss: Secondary | ICD-10-CM

## 2014-07-01 DIAGNOSIS — G309 Alzheimer's disease, unspecified: Secondary | ICD-10-CM

## 2014-07-02 ENCOUNTER — Encounter (HOSPITAL_COMMUNITY): Payer: Self-pay | Admitting: Family Medicine

## 2014-07-02 ENCOUNTER — Inpatient Hospital Stay (HOSPITAL_COMMUNITY)
Admission: EM | Admit: 2014-07-02 | Discharge: 2014-07-05 | DRG: 641 | Disposition: A | Discharge status: Home / Self Care | Payer: Medicare Other | Attending: Internal Medicine | Admitting: Internal Medicine

## 2014-07-02 DIAGNOSIS — E785 Hyperlipidemia, unspecified: Secondary | ICD-10-CM | POA: Diagnosis present

## 2014-07-02 DIAGNOSIS — F0280 Dementia in other diseases classified elsewhere without behavioral disturbance: Secondary | ICD-10-CM | POA: Diagnosis present

## 2014-07-02 DIAGNOSIS — E870 Hyperosmolality and hypernatremia: Secondary | ICD-10-CM | POA: Diagnosis present

## 2014-07-02 DIAGNOSIS — E876 Hypokalemia: Secondary | ICD-10-CM | POA: Diagnosis present

## 2014-07-02 DIAGNOSIS — E860 Dehydration: Principal | ICD-10-CM | POA: Diagnosis present

## 2014-07-02 DIAGNOSIS — M810 Age-related osteoporosis without current pathological fracture: Secondary | ICD-10-CM | POA: Diagnosis present

## 2014-07-02 DIAGNOSIS — M1A9XX0 Chronic gout, unspecified, without tophus (tophi): Secondary | ICD-10-CM | POA: Diagnosis present

## 2014-07-02 DIAGNOSIS — E11649 Type 2 diabetes mellitus with hypoglycemia without coma: Secondary | ICD-10-CM | POA: Diagnosis present

## 2014-07-02 DIAGNOSIS — G309 Alzheimer's disease, unspecified: Secondary | ICD-10-CM

## 2014-07-02 DIAGNOSIS — Z79899 Other long term (current) drug therapy: Secondary | ICD-10-CM

## 2014-07-02 DIAGNOSIS — E1122 Type 2 diabetes mellitus with diabetic chronic kidney disease: Secondary | ICD-10-CM | POA: Diagnosis present

## 2014-07-02 DIAGNOSIS — E871 Hypo-osmolality and hyponatremia: Secondary | ICD-10-CM | POA: Diagnosis present

## 2014-07-02 DIAGNOSIS — N058 Unspecified nephritic syndrome with other morphologic changes: Secondary | ICD-10-CM

## 2014-07-02 DIAGNOSIS — Z888 Allergy status to other drugs, medicaments and biological substances status: Secondary | ICD-10-CM

## 2014-07-02 DIAGNOSIS — I129 Hypertensive chronic kidney disease with stage 1 through stage 4 chronic kidney disease, or unspecified chronic kidney disease: Secondary | ICD-10-CM | POA: Diagnosis present

## 2014-07-02 DIAGNOSIS — N179 Acute kidney failure, unspecified: Secondary | ICD-10-CM | POA: Diagnosis present

## 2014-07-02 DIAGNOSIS — N183 Chronic kidney disease, stage 3 unspecified: Secondary | ICD-10-CM | POA: Diagnosis present

## 2014-07-02 DIAGNOSIS — H409 Unspecified glaucoma: Secondary | ICD-10-CM | POA: Diagnosis present

## 2014-07-02 DIAGNOSIS — Z66 Do not resuscitate: Secondary | ICD-10-CM | POA: Diagnosis present

## 2014-07-02 DIAGNOSIS — E1129 Type 2 diabetes mellitus with other diabetic kidney complication: Secondary | ICD-10-CM

## 2014-07-02 DIAGNOSIS — R7989 Other specified abnormal findings of blood chemistry: Secondary | ICD-10-CM

## 2014-07-02 DIAGNOSIS — I10 Essential (primary) hypertension: Secondary | ICD-10-CM

## 2014-07-02 HISTORY — DX: Gastro-esophageal reflux disease without esophagitis: K21.9

## 2014-07-02 HISTORY — DX: Unspecified glaucoma: H40.9

## 2014-07-02 LAB — I-STAT CHEM 8, ED
BUN: 77 mg/dL — ABNORMAL HIGH (ref 6–23)
Calcium, Ion: 1.35 mmol/L — ABNORMAL HIGH (ref 1.13–1.30)
Chloride: 114 mEq/L — ABNORMAL HIGH (ref 96–112)
Creatinine, Ser: 2.9 mg/dL — ABNORMAL HIGH (ref 0.50–1.10)
Glucose, Bld: 196 mg/dL — ABNORMAL HIGH (ref 70–99)
HCT: 43 % (ref 36.0–46.0)
Hemoglobin: 14.6 g/dL (ref 12.0–15.0)
Potassium: 4.5 mEq/L (ref 3.7–5.3)
Sodium: 159 mEq/L — ABNORMAL HIGH (ref 137–147)
TCO2: 28 mmol/L (ref 0–100)

## 2014-07-02 LAB — URINALYSIS, ROUTINE W REFLEX MICROSCOPIC
Glucose, UA: NEGATIVE mg/dL
Hgb urine dipstick: NEGATIVE
Ketones, ur: 15 mg/dL — AB
Leukocytes, UA: NEGATIVE
Nitrite: NEGATIVE
Protein, ur: NEGATIVE mg/dL
Specific Gravity, Urine: 1.017 (ref 1.005–1.030)
Urobilinogen, UA: 1 mg/dL (ref 0.0–1.0)
pH: 5 (ref 5.0–8.0)

## 2014-07-02 LAB — CBC
HCT: 42.9 % (ref 36.0–46.0)
Hemoglobin: 13.5 g/dL (ref 12.0–15.0)
MCH: 27 pg (ref 26.0–34.0)
MCHC: 31.5 g/dL (ref 30.0–36.0)
MCV: 85.8 fL (ref 78.0–100.0)
Platelets: 186 10*3/uL (ref 150–400)
RBC: 5 MIL/uL (ref 3.87–5.11)
RDW: 15.6 % — ABNORMAL HIGH (ref 11.5–15.5)
WBC: 7.5 10*3/uL (ref 4.0–10.5)

## 2014-07-02 MED ORDER — SODIUM CHLORIDE 0.9 % IV BOLUS (SEPSIS)
1000.0000 mL | Freq: Once | INTRAVENOUS | Status: AC
  Administered 2014-07-03: 1000 mL via INTRAVENOUS

## 2014-07-02 MED ORDER — SODIUM CHLORIDE 0.9 % IV BOLUS (SEPSIS): 1000.0000 mL | Freq: Once | INTRAVENOUS | Status: DC

## 2014-07-02 NOTE — ED Notes (Addendum)
Per EMS, [facility], she was found hanging over the rails but not touching the floor. Denies any injuries from patient or facility staff. Patient is alert, oriented, to her baseline. Also, she is normally sleepy. Patient is not moaning or making any sounds. She resting is quietly and appears in no distress.

## 2014-07-02 NOTE — ED Provider Notes (Signed)
CSN: [id number]     Arrival date & time 07/02/14  2015 History   First MD Initiated Contact with Patient 07/02/14 2047     Chief Complaint  Patient presents with  . Fall   Level 5 Caveat   (Consider location/radiation/quality/duration/timing/severity/associated sxs/prior Treatment) HPI  Pt is a 80yo female with hx of Alzheimer's disease, DM Type II, hyperlipidemia, and HTN presenting [facility] ED [facility] after pt was found slumped over her bedrail head first. Pt was not touching the floor. Pt is at baseline per staff.  Pt is normally sleepy and limited verbal communication.  Per triage note, pt is not moaning or making any sounds. Pt is resting quietly and appears in no distress. Per staff at facility, pt has not been sick recently. No fevers, vomiting or diarrhea.   Past Medical History  Diagnosis Date  . Diabetes mellitus type II, controlled   . Hyperlipidemia LDL goal < 100   . Gout   . Alzheimer's disease   . Hypertension, benign essential, goal below 140/90   . Senile osteoporosis   . Edema   . Glaucoma    History reviewed. No pertinent past surgical history. History reviewed. No pertinent family history. History  Substance Use Topics  . Smoking status: Unknown If Ever Smoked  . Smokeless tobacco: Not on file  . Alcohol Use: No   OB History    No data available     Review of Systems  Unable to perform ROS: Dementia      Allergies  Beta adrenergic blockers  Home Medications   Prior to Admission medications   Medication Sig Start Date End Date Taking? Authorizing Provider  allopurinol (ZYLOPRIM) 100 MG tablet Take 100 mg by mouth every other day.     Historical Provider, MD  brimonidine-timolol (COMBIGAN) 0.2-0.5 % ophthalmic solution Place 1 drop into both eyes at bedtime.    Historical Provider, MD  calcitonin, salmon, (MIACALCIN/FORTICAL) 200 UNIT/ACT nasal spray Place 1 spray into alternate nostrils daily.    Historical Provider, MD   calcium-vitamin D (OSCAL WITH D) 500-200 MG-UNIT per tablet Take 1 tablet by mouth 3 (three) times daily.    Historical Provider, MD  carboxymethylcellulose (REFRESH PLUS) 0.5 % SOLN Place 1 drop into both eyes 4 (four) times daily.    Historical Provider, MD  diclofenac sodium (VOLTAREN) 1 % GEL Apply 2 g topically 4 (four) times daily.    Historical Provider, MD  donepezil (ARICEPT) 10 MG tablet Take 10 mg by mouth at bedtime.    Historical Provider, MD  furosemide (LASIX) 20 MG tablet Take 20 mg by mouth daily with breakfast.     Historical Provider, MD  lisinopril (PRINIVIL,ZESTRIL) 2.5 MG tablet Take 5 mg by mouth daily with breakfast.     Historical Provider, MD  Memantine HCl ER (NAMENDA XR) 28 MG CP24 Take 28 mg by mouth every morning.     Historical Provider, MD  Multiple Vitamins-Iron (MULTIVITAMINS WITH IRON) TABS tablet Take 1 tablet by mouth every morning.     Historical Provider, MD  Potassium Chloride ER 20 MEQ TBCR Take 20 mEq by mouth every morning.     Historical Provider, MD  pravastatin (PRAVACHOL) 20 MG tablet Take 20 mg by mouth at bedtime.     Historical Provider, MD   BP 149/63 mmHg  Pulse 70  Temp(Src) 98.5 F (36.9 C) (Oral)  Resp 14  SpO2 98% Physical Exam  Constitutional: She appears well-developed and well-nourished.  No distress.  HENT:  Head: Normocephalic and atraumatic.  Eyes: Conjunctivae are normal. No scleral icterus.  Neck: Normal range of motion.  Cardiovascular: Normal rate, regular rhythm and normal heart sounds.   Pulmonary/Chest: Effort normal and breath sounds normal. No respiratory distress. She has no wheezes. She has no rales. She exhibits no tenderness.  Abdominal: Soft. Bowel sounds are normal. She exhibits no distension and no mass. There is no tenderness. There is no rebound and no guarding.  Musculoskeletal: Normal range of motion. She exhibits no tenderness.  Neurological: She is alert.  Able to follow commands, 5/5 strength in upper  and lower extremities bilaterally   Skin: Skin is warm and dry. She is not diaphoretic.  Nursing note and vitals reviewed.   ED Course  Procedures (including critical care time) Labs Review Labs Reviewed  URINALYSIS, ROUTINE W REFLEX MICROSCOPIC - Abnormal; Notable for the following:    Bilirubin Urine SMALL (*)    Ketones, ur 15 (*)    All other components within normal limits  CBC - Abnormal; Notable for the following:    RDW 15.6 (*)    All other components within normal limits  I-STAT CHEM 8, ED - Abnormal; Notable for the following:    Sodium 159 (*)    Chloride 114 (*)    BUN 77 (*)    Creatinine, Ser 2.90 (*)    Glucose, Bld 196 (*)    Calcium, Ion 1.35 (*)    All other components within normal limits    Imaging Review No results found.   EKG Interpretation None      MDM   Final diagnoses:  Hypernatremia  Dehydration  Elevated serum creatinine    [facility] with reports of pt being slumped over bedrail head first but pt was not touching the floor. No evidence of head trauma.  Pt is demented, limited verbal communication but able to follow commands, 5/5 strength in upper and lower extremities bilaterally. No spinal tenderness. No tenderness to chest, abdomen, arms, hips, or legs  Discussed pt with Dr. Romeo AppleHarrison, will get basic labs.   Labs: hypernatremia Na: 159 and elevated Cr 2.9 up from 1.65 from 2 months ago.  Pt appears to be dehydrated, will given pt 1L IV fluids.   Pt to be admitted for hypernatremia and acute on chronic renal failure.   11:00 PM Consulted with Dr. Clyde LundborgNiu. Will admit pt to med-surg bed. Pt is stable at this time.     Junius Finnerrin O'Malley, PA-C 07/02/14 2302  Purvis SheffieldForrest Harrison, MD 07/04/14 1224

## 2014-07-02 NOTE — ED Notes (Signed)
[facility] Expected date: 07/02/14 Expected time: 8:04 PM Means of arrival: Ambulance Comments: 80 yr old fall

## 2014-07-02 NOTE — Progress Notes (Signed)
CSW met with patient at bedside. There was no family present. Patient confirmed that she lived at [facility]. Patient was confused during the interview. Per chart, patient has a history of Alzheimers disease. Patient is heard of hearing. Patient stated that she feels "okay" right now. Patient appeared to be sleepy.   Willette Brace [phone] ED CSW  07/02/2014  9:44 PM

## 2014-07-02 NOTE — ED Notes (Signed)
Delay in patient transporting to assigned room due to waiting on IV consult. Sherlynn StallsJennifer O, RN and Wells GuilesJoAnna I, RN attempted twice for each nurse.

## 2014-07-02 NOTE — ED Notes (Signed)
IV team at bedside 

## 2014-07-02 NOTE — H&P (Addendum)
[facility] History and Physical  Vickie Sirenlyce Belongia ZOX:[id number] DOB: 1913/05/27 DOA: 07/02/2014  Referring physician: ED physician PCP: Bufford SpikesEED, TIFFANY, DO  Specialists:   Chief Complaint: hypernatremia  HPI: Vickie [name] is a 80 y.o. female  with hx of Alzheimer's disease, DM Type II, hyperlipidemia, and HTN presents hypernatremia.  Patient is resident of [facility]. Patient has Alzheimer disease at baseline.  It is very difficult to get history from patient. The history is obtained from EMS and ED physician. It seems that patient was found slumped over her bedrail head first, but not touching the floor, no head injury per staff. Pt is at baseline per staff.  Pt is normally sleepy and limited verbal communication. When I evaluated patient, she seems to be at her baseline mental status. She is quiet and seems to be comfortable without pain in anywhere. Per staff at facility, pt has not been sick recently. She dose not have fever, chills, headaches, cough, chest pain, SOB, abdominal pain, diarrhea, constipation, dysuria, urgency, frequency, hematuria, skin rashes or leg swelling.  Work up in the ED demonstrates negative urinalysis, worsening renal function with creatinine up from 1.6 to 2.9; hypernatremia with sodium of 159. Patient is admitted to inpatient for further evaluation and treatment.  Review of Systems: As presented in the history of presenting illness, rest negative.  Where does patient live?  SNF Can patient participate in ADLs? none  Allergy:  Allergies  Allergen Reactions  . Beta Adrenergic Blockers     Because of first degree AV block    Past Medical History  Diagnosis Date  . Diabetes mellitus type II, controlled   . Hyperlipidemia LDL goal < 100   . Gout   . Alzheimer's disease   . Hypertension, benign essential, goal below 140/90   . Senile osteoporosis   . Edema   . Glaucoma     History reviewed. No pertinent past surgical  history.  Social History:  reports that she does not drink alcohol or use illicit drugs. Her tobacco history is not on file.  Family History: could not obtained due to Alzheimer.  Prior to Admission medications   Medication Sig Start Date End Date Taking? Authorizing Provider  allopurinol (ZYLOPRIM) 100 MG tablet Take 100 mg by mouth every other day.     Historical Provider, MD  brimonidine-timolol (COMBIGAN) 0.2-0.5 % ophthalmic solution Place 1 drop into both eyes at bedtime.    Historical Provider, MD  calcitonin, salmon, (MIACALCIN/FORTICAL) 200 UNIT/ACT nasal spray Place 1 spray into alternate nostrils daily.    Historical Provider, MD  calcium-vitamin D (OSCAL WITH D) 500-200 MG-UNIT per tablet Take 1 tablet by mouth 3 (three) times daily.    Historical Provider, MD  carboxymethylcellulose (REFRESH PLUS) 0.5 % SOLN Place 1 drop into both eyes 4 (four) times daily.    Historical Provider, MD  diclofenac sodium (VOLTAREN) 1 % GEL Apply 2 g topically 4 (four) times daily.    Historical Provider, MD  donepezil (ARICEPT) 10 MG tablet Take 10 mg by mouth at bedtime.    Historical Provider, MD  furosemide (LASIX) 20 MG tablet Take 20 mg by mouth daily with breakfast.     Historical Provider, MD  lisinopril (PRINIVIL,ZESTRIL) 2.5 MG tablet Take 5 mg by mouth daily with breakfast.     Historical Provider, MD  Memantine HCl ER (NAMENDA XR) 28 MG CP24 Take 28 mg by mouth every morning.     Historical Provider, MD  Multiple Vitamins-Iron (MULTIVITAMINS WITH IRON)  TABS tablet Take 1 tablet by mouth every morning.     Historical Provider, MD  Potassium Chloride ER 20 MEQ TBCR Take 20 mEq by mouth every morning.     Historical Provider, MD  pravastatin (PRAVACHOL) 20 MG tablet Take 20 mg by mouth at bedtime.     Historical Provider, MD    Physical Exam: Filed Vitals:   07/02/14 2021 07/02/14 2144 07/02/14 2318  BP: 134/60 149/63 148/64  Pulse: 64 70 67  Temp: 97.6 F (36.4 C) 98.5 F (36.9 C)  98.3 F (36.8 C)  TempSrc: Oral Oral Oral  Resp: 18 14 20   SpO2: 92% 98% 100%   General: Not in acute distress, dry mucous and membrane HEENT:       Eyes: PERRL, EOMI, no scleral icterus       ENT: No discharge from the ears and nose, no pharynx injection, no tonsillar enlargement.        Neck: No JVD, no bruit, no mass felt. Cardiac: S1/S2, RRR, No murmurs, No gallops or rubs Pulm: Good air movement bilaterally. Clear to auscultation bilaterally. No rales, wheezing, rhonchi or rubs. Abd: Soft, nondistended, nontender, no rebound pain, no organomegaly, BS present Ext: No edema bilaterally. 2+DP/PT pulse bilaterally Musculoskeletal: No joint deformities, erythema, or stiffness, ROM full Skin: No rashes.  Neuro: Alert, not oriented X3, partially followed commands, cranial nerves II-XII grossly intact, muscle strength 5/5 in all extremeties, Brachial reflex 2+ bilaterally. Knee reflex 1+ bilaterally. Negative Babinski's sign  Psych: Patient is not psychotic, no suicidal or hemocidal ideation.  Labs on Admission:  Basic Metabolic Panel:  Recent Labs Lab 07/02/14 2215  NA 159*  K 4.5  CL 114*  GLUCOSE 196*  BUN 77*  CREATININE 2.90*   Liver Function Tests: No results for input(s): AST, ALT, ALKPHOS, BILITOT, PROT, ALBUMIN in the last 168 hours. No results for input(s): LIPASE, AMYLASE in the last 168 hours. No results for input(s): AMMONIA in the last 168 hours. CBC:  Recent Labs Lab 07/02/14 2215 07/02/14 2220  WBC  --  7.5  HGB 14.6 13.5  HCT 43.0 42.9  MCV  --  85.8  PLT  --  186   Cardiac Enzymes: No results for input(s): CKTOTAL, CKMB, CKMBINDEX, TROPONINI in the last 168 hours.  BNP (last 3 results) No results for input(s): PROBNP in the last 8760 hours. CBG: No results for input(s): GLUCAP in the last 168 hours.  Radiological Exams on Admission: No results found.  EKG: Independently reviewed.   Assessment/Plan Principal Problem:    Hypernatremia Active Problems:   DM (diabetes mellitus) type II controlled with renal manifestation   Hyperlipidemia with target LDL less than 100   Alzheimer's disease   Hypertension, benign essential, goal below 140/90   Senile osteoporosis   Chronic renal disease, stage 3, moderately decreased glomerular filtration rate (GFR) between 30-59 mL/min/1.73 square meter   Chronic gout   Hyponatremia   Dehydration  Hypernatremia and dehydration: it is likely due to the combination of decreased oral water intake and Lasix use. Patient is clinically dehydrated with hypovolumic status, making the central or nephrogenic diabetes insipidus less likely. She has free water deficit of 3.5 liters, likely chronic, rather than acute hyponatremia.  -will admit to med-surg bed -ED ordered 1L of NS - after NS bolus, will start D5 at 75 cc/h for 4 hours, goal is to correct Na by approximately 0.4 mEq/h or 10 mEq/24h, then check BMP to decide the rate of fluid. -check  plasma and urine Omso and urine Na level -hold lasix  HTN: Patient is on Lasix and lisinopril at home -will hold both Lasix and lisinopril due to acute one CKD-III -start amlodipine 5 mg daily  Acute on CKD-III: creatinine up from 1.6 to 2.9, most likely due to pre-renal and dehydration (BUN/Cre ratio >20). -IVF as above -check FeUrea  DM-II: diet controlled. A1c was 7.1 on 04/05/14 -monitor CBG   Gout: stable continue  Home allopurinol  Alzheimer's disease: Stable -Continue home medications: Memantine  Addendum: The repeated BMP showed Na is 156. Will give another 500 ml of NS bolus and followed by D5 at 75 cc/h for 4 hours-->then check BMP again at 11:30AM.   DVT ppx: SQ Heparin     Code Status: DNR Family Communication: None at bed side.    Disposition Plan: Admit to inpatient   Date of Service 07/02/2014    Lorretta Harp [facility] Pager [phone]  If 7PM-7AM, please contact  night-coverage [email or web address] Password [email or web address] 07/02/2014, 11:54 PM

## 2014-07-03 ENCOUNTER — Encounter (HOSPITAL_COMMUNITY): Payer: Self-pay | Admitting: *Deleted

## 2014-07-03 DIAGNOSIS — M10471 Other secondary gout, right ankle and foot: Secondary | ICD-10-CM

## 2014-07-03 LAB — BASIC METABOLIC PANEL
Anion gap: 14 (ref 5–15)
Anion gap: 13 (ref 5–15)
BUN: 80 mg/dL — AB (ref 6–23)
BUN: 69 mg/dL — AB (ref 6–23)
CALCIUM: 9.8 mg/dL (ref 8.4–10.5)
CO2: 28 meq/L (ref 19–32)
CO2: 27 mEq/L (ref 19–32)
CREATININE: 2.44 mg/dL — AB (ref 0.50–1.10)
CREATININE: 2.19 mg/dL — AB (ref 0.50–1.10)
Calcium: 9.3 mg/dL (ref 8.4–10.5)
Chloride: 114 mEq/L — ABNORMAL HIGH (ref 96–112)
Chloride: 115 mEq/L — ABNORMAL HIGH (ref 96–112)
GFR calc Af Amer: 18 mL/min — ABNORMAL LOW (ref >90)
GFR calc non Af Amer: 15 mL/min — ABNORMAL LOW (ref >90)
GFR, EST AFRICAN AMERICAN: 20 mL/min — AB (ref >90)
GFR, EST NON AFRICAN AMERICAN: 17 mL/min — AB (ref >90)
GLUCOSE: 241 mg/dL — AB (ref 70–99)
Glucose, Bld: 228 mg/dL — ABNORMAL HIGH (ref 70–99)
Potassium: 4.3 mEq/L (ref 3.7–5.3)
Potassium: 4 mEq/L (ref 3.7–5.3)
Sodium: 156 mEq/L — ABNORMAL HIGH (ref 137–147)
Sodium: 155 mEq/L — ABNORMAL HIGH (ref 137–147)

## 2014-07-03 LAB — LIPID PANEL
CHOL/HDL RATIO: 3.1 ratio
CHOLESTEROL: 163 mg/dL (ref 0–200)
HDL: 53 mg/dL (ref >39)
LDL CALC: 80 mg/dL (ref 0–99)
Triglycerides: 150 mg/dL — ABNORMAL HIGH (ref <150)
VLDL: 30 mg/dL (ref 0–40)

## 2014-07-03 LAB — CREATININE, URINE, RANDOM: Creatinine, Urine: 125 mg/dL

## 2014-07-03 LAB — GLUCOSE, CAPILLARY
Glucose-Capillary: 184 mg/dL — ABNORMAL HIGH (ref 70–99)
Glucose-Capillary: 177 mg/dL — ABNORMAL HIGH (ref 70–99)
Glucose-Capillary: 124 mg/dL — ABNORMAL HIGH (ref 70–99)

## 2014-07-03 LAB — MRSA PCR SCREENING: MRSA by PCR: NEGATIVE

## 2014-07-03 LAB — UREA NITROGEN, URINE: Urea Nitrogen, Ur: 1157 mg/dL

## 2014-07-03 LAB — SODIUM, URINE, RANDOM: SODIUM UR: 24 meq/L

## 2014-07-03 LAB — OSMOLALITY, URINE: Osmolality, Ur: 575 mOsm/kg (ref 390–1090)

## 2014-07-03 MED ORDER — HEPARIN SODIUM (PORCINE) 5000 UNIT/ML IJ SOLN
5000.0000 [IU] | Freq: Three times a day (TID) | INTRAMUSCULAR | Status: DC
  Administered 2014-07-03 (×2): 5000 [IU] via SUBCUTANEOUS
  Filled 2014-07-03 (×5): qty 1

## 2014-07-03 MED ORDER — SODIUM CHLORIDE 0.9 % IV BOLUS (SEPSIS)
500.0000 mL | Freq: Once | INTRAVENOUS | Status: AC
  Administered 2014-07-03: 500 mL via INTRAVENOUS

## 2014-07-03 MED ORDER — DEXTROSE 5 % IV SOLN
INTRAVENOUS | Status: DC
  Administered 2014-07-03: 17:00:00 via INTRAVENOUS

## 2014-07-03 MED ORDER — CALCITONIN (SALMON) 200 UNIT/ACT NA SOLN
1.0000 | Freq: Every day | NASAL | Status: DC
  Administered 2014-07-03 – 2014-07-05 (×3): 1 via NASAL
  Filled 2014-07-03: qty 3.7

## 2014-07-03 MED ORDER — ALLOPURINOL 100 MG PO TABS
100.0000 mg | ORAL_TABLET | ORAL | Status: DC
  Administered 2014-07-03 – 2014-07-05 (×2): 100 mg via ORAL
  Filled 2014-07-03 (×2): qty 1

## 2014-07-03 MED ORDER — TIMOLOL MALEATE 0.5 % OP SOLN
1.0000 [drp] | Freq: Every day | OPHTHALMIC | Status: DC
  Administered 2014-07-03 – 2014-07-04 (×2): 1 [drp] via OPHTHALMIC
  Filled 2014-07-03: qty 5

## 2014-07-03 MED ORDER — PRAVASTATIN SODIUM 20 MG PO TABS
20.0000 mg | ORAL_TABLET | Freq: Every day | ORAL | Status: DC
  Administered 2014-07-03 – 2014-07-04 (×2): 20 mg via ORAL
  Filled 2014-07-03 (×4): qty 1

## 2014-07-03 MED ORDER — TAB-A-VITE/IRON PO TABS
1.0000 | ORAL_TABLET | Freq: Every morning | ORAL | Status: DC
  Administered 2014-07-03 – 2014-07-05 (×3): 1 via ORAL
  Filled 2014-07-03 (×4): qty 1

## 2014-07-03 MED ORDER — AMLODIPINE BESYLATE 5 MG PO TABS
5.0000 mg | ORAL_TABLET | Freq: Every day | ORAL | Status: DC
  Administered 2014-07-04 – 2014-07-05 (×2): 5 mg via ORAL
  Filled 2014-07-03 (×3): qty 1

## 2014-07-03 MED ORDER — POLYVINYL ALCOHOL 1.4 % OP SOLN
1.0000 [drp] | Freq: Four times a day (QID) | OPHTHALMIC | Status: DC
  Administered 2014-07-03 – 2014-07-05 (×9): 1 [drp] via OPHTHALMIC
  Filled 2014-07-03 (×2): qty 15

## 2014-07-03 MED ORDER — DONEPEZIL HCL 10 MG PO TABS
10.0000 mg | ORAL_TABLET | Freq: Every day | ORAL | Status: DC
  Administered 2014-07-03 – 2014-07-04 (×2): 10 mg via ORAL
  Filled 2014-07-03 (×4): qty 1

## 2014-07-03 MED ORDER — BRIMONIDINE TARTRATE 0.2 % OP SOLN
1.0000 [drp] | Freq: Every day | OPHTHALMIC | Status: DC
  Administered 2014-07-03 – 2014-07-04 (×2): 1 [drp] via OPHTHALMIC
  Filled 2014-07-03: qty 5

## 2014-07-03 MED ORDER — BRIMONIDINE TARTRATE-TIMOLOL 0.2-0.5 % OP SOLN: 1.0000 [drp] | Freq: Every day | OPHTHALMIC | Status: DC

## 2014-07-03 MED ORDER — DEXTROSE 5 % IV SOLN
INTRAVENOUS | Status: AC
  Administered 2014-07-03: 02:00:00 via INTRAVENOUS

## 2014-07-03 MED ORDER — INSULIN ASPART 100 UNIT/ML ~~LOC~~ SOLN
0.0000 [IU] | Freq: Three times a day (TID) | SUBCUTANEOUS | Status: DC
  Administered 2014-07-03: 2 [IU] via SUBCUTANEOUS
  Administered 2014-07-03 – 2014-07-05 (×3): 1 [IU] via SUBCUTANEOUS

## 2014-07-03 MED ORDER — MEMANTINE HCL ER 28 MG PO CP24
28.0000 mg | ORAL_CAPSULE | Freq: Every morning | ORAL | Status: DC
  Administered 2014-07-03 – 2014-07-05 (×3): 28 mg via ORAL
  Filled 2014-07-03 (×3): qty 28

## 2014-07-03 MED ORDER — CALCIUM CARBONATE-VITAMIN D 500-200 MG-UNIT PO TABS
1.0000 | ORAL_TABLET | Freq: Three times a day (TID) | ORAL | Status: DC
  Administered 2014-07-03 – 2014-07-05 (×6): 1 via ORAL
  Filled 2014-07-03 (×9): qty 1

## 2014-07-03 MED ORDER — DEXTROSE 5 % IV SOLN: INTRAVENOUS | Status: AC

## 2014-07-03 MED ORDER — DEXTROSE 5 % IV SOLN
INTRAVENOUS | Status: AC
  Administered 2014-07-03: 08:00:00 via INTRAVENOUS

## 2014-07-03 NOTE — Progress Notes (Signed)
Contacted on call provider regarding inability to reestablish IV access on pt. Hesitant to pursue PICC, etc given pts age and DNR status. Order received to offer water to pt as often as possible tonight and obtain results of bmet in the am before making decision regarding IV access. Continue to monitor. Mick SellShannon Adleigh Mcmasters RN

## 2014-07-03 NOTE — Progress Notes (Addendum)
Patient ID: Vickie [name], female   DOB: [birth year], 80 y.o.   MRN: [id number] [facility] PROGRESS NOTE  Vickie [name] [id number] DOB: [birth year] DOA: 07/02/2014 PCP: Bufford SpikesEED, TIFFANY, DO  Brief narrative:    80 y.o. female with history of Alzheimer's dementia, diabetes, dyslipidemia, gout who [facility] lethargic, altered mental status. Pt at baseline with very limited communication, minimally verbal. Patient unable to provide history of medical illness due to dementia. On admission, patient was found to have hypernatremia of 159. She was started on D5 water infusion. Sodium improving minimally. She was also found to have worsening renal function with creatinine up from 1.6 to 2.9.  Assessment/Plan:    Principal Problem:   Hypernatremia / dehydration  - likely dehydration and due to history of Alzheimer's dementia  - continue current IV fluids - follow up BMP in am  Active Problems:   DM (diabetes mellitus) type II controlled with renal manifestation - continue SSI. - CBG's since admission: 184, 177   Hyperlipidemia with target LDL less than 100 - continue Pravachol    Alzheimer's disease - continue aricept and namenda    Hypertension, benign essential - resumed norvasc 5 mg daily    Chronic renal disease, stage 3, moderately decreased glomerular filtration rate (GFR) between 30-59  - continue to monitor renal function - renal function improving slightly, 2.44 --> 2.19   Chronic gout - continue allopurinol   DVT Prophylaxis   - SCD's bilaterally    Code Status: DNR/DNI Family Communication:  plan of care discussed with the patient Disposition Plan: Home when stable.   IV access:   Peripheral IV  Procedures and diagnostic studies:    No results found.  Medical Consultants:   None  Other Consultants:   PT/OT IAnti-Infectives:    None    Manson PasseyEVINE, Kamilo Och, MD  [facility] Pager [phone]  If 7PM-7AM, please  contact night-coverage [email or web address] Password [email or web address] 07/03/2014, 4:25 PM   LOS: 1 day    HPI/Subjective: No acute overnight events.  Objective: Filed Vitals:   07/03/14 0054 07/03/14 0120 07/03/14 0423 07/03/14 1337  BP: 132/52  119/61 141/68  Pulse: 76 78 74 60  Temp: 98.1 F (36.7 C)  97.8 F (36.6 C) 97.4 F (36.3 C)  TempSrc: Oral  Oral Oral  Resp: 18  16 18   Height: 5\' 1"  (1.549 m)     Weight: 50.8 kg (111 lb 15.9 oz)  50.349 kg (111 lb)   SpO2: 94%  100%     Intake/Output Summary (Last 24 hours) at 07/03/14 1625 Last data filed at 07/03/14 1300  Gross per 24 hour  Intake 3088.75 ml  Output      0 ml  Net 3088.75 ml    Exam:   General:  Pt is not in acute distress  Cardiovascular: Regular rate and rhythm, S1/S2 appreciated   Respiratory: no wheezing, no crackles, no rhonchi  Abdomen: Soft, non tender, non distended, bowel sounds present  Extremities: pulses DP and PT palpable bilaterally  Neuro: Grossly nonfocal  Data Reviewed: Basic Metabolic Panel:  Recent Labs Lab 07/02/14 2215 07/03/14 0545 07/03/14 1215  NA 159* 156* 155*  K 4.5 4.3 4.0  CL 114* 114* 115*  CO2  --  28 27  GLUCOSE 196* 241* 228*  BUN 77* 80* 69*  CREATININE 2.90* 2.44* 2.19*  CALCIUM  --  9.8 9.3   Liver Function Tests: No results for input(s): AST, ALT, ALKPHOS, BILITOT, PROT, ALBUMIN in  the last 168 hours. No results for input(s): LIPASE, AMYLASE in the last 168 hours. No results for input(s): AMMONIA in the last 168 hours. CBC:  Recent Labs Lab 07/02/14 2215 07/02/14 2220  WBC  --  7.5  HGB 14.6 13.5  HCT 43.0 42.9  MCV  --  85.8  PLT  --  186   Cardiac Enzymes: No results for input(s): CKTOTAL, CKMB, CKMBINDEX, TROPONINI in the last 168 hours. BNP: Invalid input(s): POCBNP CBG:  Recent Labs Lab 07/03/14 0803 07/03/14 1138  GLUCAP 184* 177*    Recent Results (from the past 240 hour(s))  MRSA PCR Screening     Status: None   Collection Time:  07/03/14  5:36 AM  Result Value Ref Range Status   MRSA by PCR NEGATIVE NEGATIVE Final     Scheduled Meds: . allopurinol  100 mg Oral QODAY  . amLODipine  5 mg Oral Daily  . brimonidine  1 drop Both Eyes QHS  . calcitonin (salmon)  1 spray Alternating Nares Daily  . calcium-vitamin D  1 tablet Oral TID  . donepezil  10 mg Oral QHS  . heparin  5,000 Units Subcutaneous 3 times per day  . insulin aspart  0-9 Units Subcutaneous TID WC  . Memantine HCl ER  28 mg Oral q morning - 10a  . multivitamins with iron  1 tablet Oral q morning - 10a  . polyvinyl alcohol  1 drop Both Eyes QID  . pravastatin  20 mg Oral QHS  . timolol  1 drop Both Eyes QHS   Continuous Infusions: . dextrose

## 2014-07-03 NOTE — Progress Notes (Addendum)
No family to report. Pt with dementia denies being scared about anything and without signs of neglect of abuse.

## 2014-07-03 NOTE — Plan of Care (Signed)
Problem: Consults Goal: Diabetes Guidelines if Diabetic/Glucose > 140 If diabetic or lab glucose is > 140 mg/dl - Initiate Diabetes/Hyperglycemia Guidelines & Document Interventions  Outcome: Completed/Met Date Met:  07/03/14 CBG' were greater than 140.  Dr. Charlies Silvers was notified and SSI initiated.

## 2014-07-03 NOTE — Progress Notes (Signed)
PT Cancellation Note  Patient Details Name: Vickie [name] MRN: [id number] DOB: March 29, 1913   Cancelled Treatment:    Reason Eval/Treat Not Completed: Other (comment) Pt on strict bed rest.  Please update activity level once pt able to participate with therapy.  Thanks.   Jaze Rodino,KATHrine E 07/03/2014, 12:01 PM Zenovia JarredKati Teryn Gust, PT, DPT 07/03/2014 Pager: [phone]

## 2014-07-03 NOTE — Progress Notes (Signed)
OT Cancellation Note  Patient Details Name: Vickie [name] MRN: [id number] DOB: 1913-07-12   Cancelled Treatment:    Reason Eval/Treat Not Completed: Other (comment) Strict bedrest orders. Please update activity orders when appropriate in order to begin OT. Thank you [facility]ILLARY  Shell Yandow, OTR/L  [phone] 07/03/2014 07/03/2014, 1:45 PM

## 2014-07-03 NOTE — Progress Notes (Signed)
Patient ID: Vickie [name], female   DOB: 1913-02-25, 80 y.o.   MRN: [id number]  [facility]     Allergies  Allergen Reactions  . Beta Adrenergic Blockers     Because of first degree AV block       Chief Complaint  Patient presents with  . Acute Visit    weight loss     HPI:  Staff reports that she is losing weight. Her weight in Sept of this year was 135 pounds her current weight is 119 pounds. Her po intake is very poor along with her fluid intake. Her dementia is advanced weight loss is expected; we could stop medications which could ben affecting her appetite and could be affecting her renal function. She is unable to fully participate in the hpi or ros.    Past Medical History  Diagnosis Date  . Diabetes mellitus type II, controlled   . Hyperlipidemia LDL goal < 100   . Gout   . Alzheimer's disease   . Hypertension, benign essential, goal below 140/90   . Senile osteoporosis   . Edema   . Glaucoma   . GERD (gastroesophageal reflux disease)     No past surgical history on file.  VITAL SIGNS BP 137/88 mmHg  Pulse 68  Ht 5\' 1"  (1.549 m)  Wt 119 lb (53.978 kg)  BMI 22.50 kg/m2  SpO2 98%   Outpatient Encounter Prescriptions as of 07/01/2014  Medication Sig  . allopurinol (ZYLOPRIM) 100 MG tablet Take 100 mg by mouth every other day.   . brimonidine-timolol (COMBIGAN) 0.2-0.5 % ophthalmic solution Place 1 drop into both eyes at bedtime.  . calcitonin, salmon, (MIACALCIN/FORTICAL) 200 UNIT/ACT nasal spray Place 1 spray into alternate nostrils daily.  . calcium-vitamin D (OSCAL WITH D) 500-200 MG-UNIT per tablet Take 1 tablet by mouth 3 (three) times daily.  . carboxymethylcellulose (REFRESH PLUS) 0.5 % SOLN Place 1 drop into both eyes 4 (four) times daily.  . diclofenac sodium (VOLTAREN) 1 % GEL Apply 2 g topically 4 (four) times daily.  Marland Kitchen. donepezil (ARICEPT) 10 MG tablet Take 10 mg by mouth at bedtime.  . furosemide (LASIX) 20 MG tablet Take 20 mg by mouth  daily with breakfast.   . lisinopril (PRINIVIL,ZESTRIL) 2.5 MG tablet Take 5 mg by mouth daily with breakfast.   . Memantine HCl ER (NAMENDA XR) 28 MG CP24 Take 28 mg by mouth every morning.   . Multiple Vitamins-Iron (MULTIVITAMINS WITH IRON) TABS tablet Take 1 tablet by mouth every morning.   . Potassium Chloride ER 20 MEQ TBCR Take 20 mEq by mouth every morning.   . pravastatin (PRAVACHOL) 20 MG tablet Take 20 mg by mouth at bedtime.      SIGNIFICANT DIAGNOSTIC EXAMS  04-01-14: ABI: moderate PAD    LABS REVIEWED:   01-24-14: wbc 4.9; hgb 11.1; hct 37.5; mcv 86.8; plt 259; glucose 219; bun 28.6; creat 1.64; k+4.9; na++142; liver normal albumin 3.6 04-05-14: hgb a1c 7.1; pre-albumin 27     Review of Systems  Unable to perform ROS    Physical Exam   Constitutional: No distress.  thin  Neck: Neck supple. No JVD present. No thyromegaly present.  Cardiovascular: Normal rate, regular rhythm and intact distal pulses.   Respiratory: Effort normal and breath sounds normal. No respiratory distress.  GI: Soft. Bowel sounds are normal. She exhibits no distension. There is no tenderness.  Musculoskeletal: She exhibits no edema.  Is able to move all extremities   Neurological: She is  alert.  Skin: Skin is warm and dry. She is not diaphoretic    ASSESSMENT/ PLAN:    1. Weight loss 2. FTT 3. Alzheimer's disease Will stop the aricept; pravachol; lasix and k+ Due to her increased oral secretions will begin a scopolamine patch every 3 days  Will setup a family care plan meeting in order to discuss future care. She would benefit from a comfort care situation; however; this decision is one that family will have to make. Will monitor her status.    Synthia Innocenteborah Alizay Bronkema NP [facility]  Contact [phone] Monday through Friday 8am- 5pm  After hours call [phone]

## 2014-07-03 NOTE — Progress Notes (Signed)
INITIAL NUTRITION ASSESSMENT  DOCUMENTATION CODES Per approved criteria  -Not Applicable   INTERVENTION: -Monitor for Goals of Care -Encourage PO intake -RD to continue to monitor   NUTRITION DIAGNOSIS: Inadequate oral intake related to altered mental status as evidenced by poor PO intake and 15% wt loss x 2 weeks.   Goal: Pt to meet >/= 90% of their estimated nutrition needs   Monitor:  PO intake, goals of care, weight, labs, I/O's  Reason for Assessment: Pt identified as at nutrition risk on the Malnutrition Screen Tool  Admitting Dx: Hypernatremia  ASSESSMENT: 80 y.o. female with hx of Alzheimer's disease, DM Type II, hyperlipidemia, and HTN presents hypernatremia.  Pt was alert during RD visit but was unable to provide any history. No family present in room.  Per NP note, comfort care is recommended. Family to make decision.  PO intake: 50%.  Pt with 19 lb weight loss since 12/1, 15% weight loss x 2 weeks, significant for time frame.  Unable to perform nutrition focused physical exam as pt was confused. Noted signs of depletion in facial and clavicle regions.   Labs reviewed:  Elevated Na, BUN & Creatinine Glucose 228   Height: Ht Readings from Last 1 Encounters:  07/03/14 5\' 1"  (1.549 m)    Weight: Wt Readings from Last 1 Encounters:  07/03/14 111 lb (50.349 kg)    Ideal Body Weight: 105 lb  % Ideal Body Weight: 106%  Wt Readings from Last 10 Encounters:  07/03/14 111 lb (50.349 kg)  07/01/14 119 lb (53.978 kg)  06/18/14 130 lb (58.968 kg)  05/29/14 130 lb (58.968 kg)  04/17/14 135 lb (61.236 kg)  01/23/14 138 lb (62.596 kg)  10/24/13 139 lb (63.05 kg)  04/13/13 147 lb (66.679 kg)  01/02/13 146 lb (66.225 kg)  11/14/12 147 lb (66.679 kg)    Usual Body Weight: Unable to obtain from pt  % Usual Body Weight: NA  BMI:  Body mass index is 20.98 kg/(m^2).  Estimated Nutritional Needs: Kcal: 1300-1500 Protein: 65-75g Fluid: 1.5L/day  Skin:  intact  Diet Order: Diet Carb Modified  EDUCATION NEEDS: -No education needs identified at this time   Intake/Output Summary (Last 24 hours) at 07/03/14 1146 Last data filed at 07/03/14 0630  Gross per 24 hour  Intake 1822.5 ml  Output      0 ml  Net 1822.5 ml    Last BM: 12/16  Labs:   Recent Labs Lab 07/02/14 2215 07/03/14 0545  NA 159* 156*  K 4.5 4.3  CL 114* 114*  CO2  --  28  BUN 77* 80*  CREATININE 2.90* 2.44*  CALCIUM  --  9.8  GLUCOSE 196* 241*    CBG (last 3)   Recent Labs  07/03/14 0803 07/03/14 1138  GLUCAP 184* 177*    Scheduled Meds: . allopurinol  100 mg Oral QODAY  . amLODipine  5 mg Oral Daily  . brimonidine  1 drop Both Eyes QHS  . calcitonin (salmon)  1 spray Alternating Nares Daily  . calcium-vitamin D  1 tablet Oral TID  . donepezil  10 mg Oral QHS  . heparin  5,000 Units Subcutaneous 3 times per day  . insulin aspart  0-9 Units Subcutaneous TID WC  . Memantine HCl ER  28 mg Oral q morning - 10a  . multivitamins with iron  1 tablet Oral q morning - 10a  . polyvinyl alcohol  1 drop Both Eyes QID  . pravastatin  20 mg Oral  QHS  . timolol  1 drop Both Eyes QHS    Continuous Infusions:   Past Medical History  Diagnosis Date  . Diabetes mellitus type II, controlled   . Hyperlipidemia LDL goal < 100   . Gout   . Alzheimer's disease   . Hypertension, benign essential, goal below 140/90   . Senile osteoporosis   . Edema   . Glaucoma   . GERD (gastroesophageal reflux disease)     History reviewed. No pertinent past surgical history.  Tilda FrancoLindsey Susann Lawhorne, MS, RD, LDN Pager: [phone] After Hours Pager: [phone]

## 2014-07-03 NOTE — Progress Notes (Signed)
PT and OT unable to work with patient due to current activity order of strict bedrest.  Will notify Dr. Elisabeth Pigeonevine to update order.  Allayne ButcherMiller, Dianelys Scinto [facility]  07/03/2014  2:26 PM

## 2014-07-03 NOTE — Progress Notes (Signed)
Clinical Social Work Department BRIEF PSYCHOSOCIAL ASSESSMENT 07/03/2014  Patient:  Vickie [name], Vickie [name]     Account Number:  [id number]     Admit date:  07/02/2014  Clinical Social Worker:  Maryln Manuel  Date/Time:  07/03/2014 03:51 PM  Referred by:  Physician  Date Referred:  07/03/2014 Referred for  SNF Placement   Other Referral:   Interview type:  Family Other interview type:    PSYCHOSOCIAL DATA Living Status:  FAMILY Admitted from facility:  [facility] Level of care:  [place] Primary support name:  Vickie [name]/niece/[phone] Primary support relationship to patient:  FAMILY Degree of support available:   strong    CURRENT CONCERNS Current Concerns  Post-Acute Placement   Other Concerns:    SOCIAL WORK ASSESSMENT / PLAN CSW received referral that pt admitted from [facility].    CSW received return phone call from pt niece, Vickie [name]. CSW introduced self and explained role. Pt niece confirmed that pt is a resident at [facility]. Pt niece shared that pt has been at the facility for ten years. Pt niece stated that plan will be for pt to return to [facility] when pt medically ready for discharge. CSW provided support as pt niece discussed that pt has had a number of falls recently and pt niece met with director of nursing at facility to discuss. Per pt niece, facility is going to get an air mattress in place at facility that will help with pt remaining in the bed. Pt niece inquired about other interventions that facility can put into place to prevent falls. CSW discussed that [facility], Vickie [name] can provide pt niece with information regarding if there is anything else that facility can offer to assist in preventing falls. Pt niece agreeable to agreeable to speak further with [facility] liaison to address these concerns. CSW notified [facility] liaison,  Vickie [name].    CSW completed FL2 and sent clinicals to [facility].    CSW to continue to follow to provide support and assist with pt return to [facility] when pt medically ready for discharge.   Assessment/plan status:  Psychosocial Support/Ongoing Assessment of Needs Other assessment/ plan:   discharge planning   Information/referral to community resources:   Referral to [facility]: Pt alert and oriented to person only. Pt niece is supportive and actively involved in pt care. Pt niece is a strong advocate for pt and has met with [place] to discuss concerns. Pt niece is agreeable to pt returning to [facility].   Vickie [name], MSW, [place] Work [phone]

## 2014-07-03 NOTE — Progress Notes (Signed)
Patient admitted from ED to room 1327. Patient calm/cooperative. VSS. Patient without complaints. Alert oriented to self only. Bed alarm set. Patient oriented to room.

## 2014-07-03 NOTE — ED Notes (Signed)
IV team remains at bedside 

## 2014-07-03 NOTE — Progress Notes (Signed)
Dear Doctor: Attempted x 2 unsuccessfully with U/S to establish IV access. This patient has been identified as a candidate for PICC for the following reason (s): poor veins/poor circulatory system (CHF, COPD, emphysema, diabetes, steroid use, IV drug abuse, etc.) If you agree, please write an order for the indicated device. For any questions contact the Vascular Access Team at [phone] if no answer, please leave a message.  Thank you for supporting the early vascular access assessment program.

## 2014-07-03 NOTE — Progress Notes (Signed)
Called and spoke to [place] in lab. There is "enough" urine from earlier collection for ua still in lab to "run" the other tests ordered.

## 2014-07-03 NOTE — Progress Notes (Signed)
CSW received referral that pt admitted from [facility].   CSW visited pt room. No family present at bedside. Per chart, pt oriented to person only.  CSW contacted pt niece, Beverely RisenRoslyn Smith via telephone. CSW left voice message.  CSW to await return phone call from pt niece and complete full psychosocial assessment at that time.   CSW to continue to follow.  Loletta SpecterSuzanna Dejour Vos, MSW, LCSW Clinical Social Work [phone]

## 2014-07-04 LAB — BASIC METABOLIC PANEL
Anion gap: 11 (ref 5–15)
BUN: 45 mg/dL — ABNORMAL HIGH (ref 6–23)
CO2: 26 mEq/L (ref 19–32)
Calcium: 9 mg/dL (ref 8.4–10.5)
Chloride: 107 mEq/L (ref 96–112)
Creatinine, Ser: 1.77 mg/dL — ABNORMAL HIGH (ref 0.50–1.10)
GFR calc Af Amer: 26 mL/min — ABNORMAL LOW (ref >90)
GFR calc non Af Amer: 23 mL/min — ABNORMAL LOW (ref >90)
Glucose, Bld: 128 mg/dL — ABNORMAL HIGH (ref 70–99)
Potassium: 3.6 mEq/L — ABNORMAL LOW (ref 3.7–5.3)
Sodium: 144 mEq/L (ref 137–147)

## 2014-07-04 LAB — GLUCOSE, CAPILLARY
GLUCOSE-CAPILLARY: 41 mg/dL — AB (ref 70–99)
GLUCOSE-CAPILLARY: 29 mg/dL — AB (ref 70–99)
Glucose-Capillary: 88 mg/dL (ref 70–99)
Glucose-Capillary: 134 mg/dL — ABNORMAL HIGH (ref 70–99)
Glucose-Capillary: 201 mg/dL — ABNORMAL HIGH (ref 70–99)
Glucose-Capillary: 38 mg/dL — CL (ref 70–99)
Glucose-Capillary: 143 mg/dL — ABNORMAL HIGH (ref 70–99)

## 2014-07-04 MED ORDER — POTASSIUM CHLORIDE CRYS ER 20 MEQ PO TBCR
40.0000 meq | EXTENDED_RELEASE_TABLET | Freq: Once | ORAL | Status: AC
  Administered 2014-07-04: 40 meq via ORAL
  Filled 2014-07-04: qty 2

## 2014-07-04 MED ORDER — DEXTROSE 5 % IV SOLN
INTRAVENOUS | Status: DC
  Administered 2014-07-05: 01:00:00 via INTRAVENOUS

## 2014-07-04 MED ORDER — GLUCOSE 40 % PO GEL
ORAL | Status: AC
  Administered 2014-07-04: 18:00:00
  Filled 2014-07-04: qty 1

## 2014-07-04 MED ORDER — DEXTROSE 50 % IV SOLN
INTRAVENOUS | Status: AC
  Filled 2014-07-04: qty 50

## 2014-07-04 NOTE — Progress Notes (Signed)
Patient ID: Vickie [name], female   DOB: 1913/03/01, 80 y.o.   MRN: [id number] [facility] PROGRESS NOTE  Vickie [name] ZHY:[id number] DOB: 1913/03/01 DOA: 07/02/2014 PCP: Bufford SpikesEED, TIFFANY, DO  Brief narrative:    80 y.o. female with history of Alzheimer's dementia, diabetes, dyslipidemia, gout who [facility] lethargic, altered mental status. Pt at baseline with very limited communication, minimally verbal. Patient unable to provide history of medical illness due to dementia. On admission, patient was found to have hypernatremia of 159. She was started on D5 water infusion. Sodium improving minimally. She was also found to have worsening renal function with creatinine up from 1.6 to 2.9.  Assessment/Plan:    Principal Problem:   Hypernatremia / dehydration  - likely hypernatremia due to dehydration  - sodium is WNL this morning.  Active Problems:   DM (diabetes mellitus) type II controlled, with renal manifestation - continue SSI. - CBG's in past 24 hours: 38, 29, 201 - we continued IV fluids but changed it to D5% water.    Hyperlipidemia with target LDL less than 100 - continue Pravachol     Alzheimer's disease - continue aricept and namenda     Hypertension, benign essential - resumed norvasc 5 mg daily     Chronic renal disease, stage 3, moderately decreased glomerular filtration rate (GFR) between 30-59  - continue to monitor renal function - renal function improving, 2.44 --> 2.19 --> 1.77    Hypokalemia - unclear etiology - supplemented. Follow up BMP in am     Chronic gout - continue allopurinol   DVT Prophylaxis   - SCD's bilaterally    Code Status: DNR/DNI Family Communication:  Family not at the bedside this am; I called patient's niece Vickie [name]. Phone number in EPIC [phone]. This number is listed as home number. I left my cell number to reach me for updates.  Disposition Plan: to SNF once stable.   IV  access:   Peripheral IV  Procedures and diagnostic studies:    No results found.  Medical Consultants:   None   Other Consultants:   PT/OT  SW  IAnti-Infectives:    None    Manson PasseyEVINE, Phares Zaccone, MD  [facility] Pager [phone]  If 7PM-7AM, please contact night-coverage [email or web address] Password [email or web address] 07/04/2014, 6:10 PM   LOS: 2 days    HPI/Subjective: No acute overnight events.  Objective: Filed Vitals:   07/03/14 1337 07/03/14 2143 07/04/14 0648 07/04/14 1347  BP: 141/68 134/60 174/77 135/56  Pulse: 60 87 52 60  Temp: 97.4 F (36.3 C) 97.6 F (36.4 C) 97.8 F (36.6 C) 98.2 F (36.8 C)  TempSrc: Oral Oral Oral Oral  Resp: 18 18 18 18   Height:      Weight:   54.3 kg (119 lb 11.4 oz)   SpO2:  92% 98% 96%    Intake/Output Summary (Last 24 hours) at 07/04/14 1810 Last data filed at 07/04/14 1725  Gross per 24 hour  Intake    720 ml  Output      0 ml  Net    720 ml    Exam:   General:  Pt is not in acute distress  Cardiovascular: Regular rate and rhythm, S1/S2 appreciated   Respiratory: no wheezing, no crackles, no rhonchi  Abdomen: Soft, non tender, non distended, bowel sounds present  Extremities: pulses DP and PT palpable bilaterally  Neuro: Grossly nonfocal  Data Reviewed: Basic Metabolic Panel:  Recent Labs Lab 07/02/14 2215 07/03/14  [id number] 07/03/14 1215 07/04/14 0805  NA 159* 156* 155* 144  K 4.5 4.3 4.0 3.6*  CL 114* 114* 115* 107  CO2  --  28 27 26   GLUCOSE 196* 241* 228* 128*  BUN 77* 80* 69* 45*  CREATININE 2.90* 2.44* 2.19* 1.77*  CALCIUM  --  9.8 9.3 9.0   Liver Function Tests: No results for input(s): AST, ALT, ALKPHOS, BILITOT, PROT, ALBUMIN in the last 168 hours. No results for input(s): LIPASE, AMYLASE in the last 168 hours. No results for input(s): AMMONIA in the last 168 hours. CBC:  Recent Labs Lab 07/02/14 2215 07/02/14 2220  WBC  --  7.5  HGB 14.6 13.5  HCT 43.0 42.9  MCV  --  85.8  PLT  --  186    Cardiac Enzymes: No results for input(s): CKTOTAL, CKMB, CKMBINDEX, TROPONINI in the last 168 hours. BNP: Invalid input(s): POCBNP CBG:  Recent Labs Lab 07/03/14 1138 07/03/14 1737 07/04/14 0735 07/04/14 1232 07/04/14 1712  GLUCAP 177* 124* 88 134* 41*    MRSA PCR Screening     Status: None   Collection Time: 07/03/14  5:36 AM  Result Value Ref Range Status   MRSA by PCR NEGATIVE NEGATIVE Final     Scheduled Meds: . allopurinol  100 mg Oral QODAY  . amLODipine  5 mg Oral Daily  . calcitonin (salmon)  1 spray Alternating Nares Daily  . calcium-vitamin D  1 tablet Oral TID  . dextrose      . donepezil  10 mg Oral QHS  . insulin aspart  0-9 Units Subcutaneous TID WC  . Memantine HCl ER  28 mg Oral q morning - 10a  . multivitamins with iron  1 tablet Oral q morning - 10a  . pravastatin  20 mg Oral QHS   Continuous Infusions: . dextrose

## 2014-07-04 NOTE — Progress Notes (Signed)
Vickie [name] had a hypoglycemic episode at 1710, CBG of 41. This was treated with 8 oz orange juice and CBG rechecked at 1730. It had dropped to 29. Patient was treated with one tube of glucose gel, IV team, and phlebotomy was called for a state serum glucose. At 1810, CBG was checked and was 210. IV started with D5 at 8150mL/Hr. Lab was cancelled due to the resolution of the episode.

## 2014-07-04 NOTE — Evaluation (Signed)
Physical Therapy Evaluation Patient Details Name: Vickie [name] MRN: [id number] DOB: 07-Dec-1912 Today's Date: 07/04/2014   History of Present Illness  Pt is a 80yo female with hx of Alzheimer's disease, DM Type II, hyperlipidemia, and HTN [facility] after pt was found slumped over her bedrail head first. Pt was not touching the floor. Pt is at baseline per staff.  Pt is normally sleepy and limited verbal communication.  Per triage note, pt is not moaning or making any sounds. Pt is resting quietly and appears in no distress. Per staff at facility, pt has not been sick recently. No fevers, vomiting or diarrhea.   Clinical Impression  Pt admitted with above diagnosis. Pt currently with functional limitations due to the deficits listed below (see PT Problem List).  Pt will benefit from skilled PT to increase their independence and safety with mobility to allow discharge to the venue listed below.  Feel pt is close to baseline level, but would benefit from acute PT to prevent decline in functional status during hospital stay.  Pt giving good effort for age and cognition level.  Recommend returning to SNF.     Follow Up Recommendations SNF    Equipment Recommendations  None recommended by PT    Recommendations for Other Services       Precautions / Restrictions        Mobility  Bed Mobility Overal bed mobility: Needs Assistance Bed Mobility: Supine to Sit     Supine to sit: Mod assist;HOB elevated        Transfers Overall transfer level: Needs assistance   Transfers: Sit to/from Stand;Squat Pivot Transfers Sit to Stand: Max assist         General transfer comment: Stood to RW with MAX of 1.  Pt demonstrating very flexed posture.  Took 1 small step but not functional.  Sat back down and performed squat pivot with recliner arm rest down.  Pt attempting to A with squat pivot but still at MAX level,.  Ambulation/Gait                 Stairs            Wheelchair Mobility    Modified Rankin (Stroke Patients Only)       Balance Overall balance assessment: Needs assistance Sitting-balance support: Bilateral upper extremity supported Sitting balance-Leahy Scale: Fair       Standing balance-Leahy Scale: Zero Standing balance comment: flexed posture even with RW                             Pertinent Vitals/Pain Pain Assessment: No/denies pain    Home Living Family/patient expects to be discharged to:: Skilled nursing facility                      Prior Function           Comments: Pt reports she did get up to w/c everyday with one person A and was able to propel w/c with feet, however pt is questionable historian due to dementia.     Hand Dominance        Extremity/Trunk Assessment   Upper Extremity Assessment: Generalized weakness           Lower Extremity Assessment: Generalized weakness         Communication      Cognition Arousal/Alertness: Awake/alert Behavior During Therapy: WFL for tasks  assessed/performed Overall Cognitive Status: History of cognitive impairments - at baseline                      General Comments      Exercises        Assessment/Plan    PT Assessment Patient needs continued PT services  PT Diagnosis Generalized weakness   PT Problem List Decreased activity tolerance;Decreased cognition;Decreased strength;Decreased mobility  PT Treatment Interventions Functional mobility training;Therapeutic activities   PT Goals (Current goals can be found in the Care Plan section) Acute Rehab PT Goals Patient Stated Goal: none stated PT Goal Formulation: Patient unable to participate in goal setting Time For Goal Achievement: 07/18/14 Potential to Achieve Goals: Good    Frequency Min 2X/week   Barriers to discharge        Co-evaluation               End of Session Equipment Utilized During Treatment: Gait  belt Activity Tolerance: Patient tolerated treatment well Patient left: in chair;with call bell/phone within reach (chair alarm pad in place and nursing getting box) Nurse Communication: Mobility status         Time: [id number] PT Time Calculation (min) (ACUTE ONLY): 27 min   Charges:   PT Evaluation $Initial PT Evaluation Tier I: 1 Procedure PT Treatments $Therapeutic Activity: 8-22 mins   PT G Codes:          Aune Adami LUBECK 07/04/2014, 12:36 PM

## 2014-07-04 NOTE — Progress Notes (Signed)
CRITICAL VALUE ALERT  Critical value received:  CBG 41  Date of notification:  17-Dec  Time of notification:  1710  Critical value read back:yes  Rondall AllegraFred Opie Fanton, RN  Dr. Elisabeth Pigeonevine  Time of first page:  1715  MD notified (2nd page):  Time of second page:  Responding MD:  Dr. Elisabeth Pigeonevine  Time MD responded:  [phone]

## 2014-07-04 NOTE — Progress Notes (Signed)
OT Note  Patient Details Name: Vickie [name] MRN: [id number] DOB: Nov 30, 1912   Cancelled Treatment:    Reason Eval/Treat Not Completed: OT screened, no needs identified, will sign off (Patient long-term SNF resident with Alzheimer's dementia -- likely requires A with ADLs by staff at baseline)  [name] A 07/04/2014, 8:50 AM

## 2014-07-05 DIAGNOSIS — M10472 Other secondary gout, left ankle and foot: Secondary | ICD-10-CM

## 2014-07-05 LAB — GLUCOSE, CAPILLARY
Glucose-Capillary: 146 mg/dL — ABNORMAL HIGH (ref 70–99)
Glucose-Capillary: 134 mg/dL — ABNORMAL HIGH (ref 70–99)
Glucose-Capillary: 130 mg/dL — ABNORMAL HIGH (ref 70–99)

## 2014-07-05 MED ORDER — AMLODIPINE BESYLATE 5 MG PO TABS: 5.0000 mg | ORAL_TABLET | Freq: Every day | ORAL | Status: AC

## 2014-07-05 MED ORDER — POTASSIUM CL IN DEXTROSE 5% 20 MEQ/L IV SOLN
20.0000 meq | INTRAVENOUS | Status: DC
  Administered 2014-07-05: 20 meq via INTRAVENOUS
  Filled 2014-07-05: qty 1000

## 2014-07-05 NOTE — Discharge Summary (Signed)
Physician Discharge Summary  Vickie Sirenlyce Crall ZOX:[id number] DOB: July 15, 1913 DOA: 07/02/2014  PCP: Bufford SpikesEED, TIFFANY, DO  Admit date: 07/02/2014 Discharge date: 07/05/2014   Recommendations for Outpatient Follow-Up:   1. Recommend close attention to high fall risk and implementation of measures to reduce this.  Consider evaluation by PCP for safety belt or other method of restraint to minimize fall risk. 2. Recommend attention to frequent encouragement of fluid intake to help prevent recurrent dehydration. 3. Note: Antihypertensive changed from lisinopril to Norvasc due to the risk of recurrent dehydration/renal failure.   Discharge Diagnosis:   Principal Problem:    Hypernatremia secondary to dehydration Active Problems:    DM (diabetes mellitus) type II controlled with renal manifestations    Hypoglycemia    Hyperlipidemia with target LDL less than 100    Alzheimer's disease    Hypertension, benign essential, goal below 140/90    Senile osteoporosis    Chronic renal disease, stage 3, moderately decreased glomerular filtration rate (GFR) between 30-59 mL/min/1.73 square meter    Chronic gout    Dehydration   Discharge Condition: Improved.  Diet recommendation: Carbohydrate-modified.    History of Present Illness:   Vickie [name] is an 80 y.o. female with history of Alzheimer's dementia, diabetes, dyslipidemia, gout who [facility], with altered mental status. Pt at baseline with very limited communication, minimally verbal. Patient unable to provide history of medical illness due to dementia. On admission, patient was found to have hypernatremia of 159. She was started on D5 water infusion. Sodium improving minimally. She was also found to have worsening renal function with creatinine up from 1.6 to 2.9.   Hospital Course by Problem:   Principal Problem:  Hypernatremia / dehydration  - Hypernatremia due to  dehydration, resolved with rehydration.    Active Problems:  DM (diabetes mellitus) type II controlled, with renal manifestation /hypoglycemia  - Diet controlled at baseline.  Dextrose added to IVF to correct hypoglycemia.   Hyperlipidemia with target LDL less than 100  - Continue Pravachol.  Alzheimer's disease  - Continue aricept and namenda.   Hypertension, benign essential  -Continue norvasc 5 mg daily.  Lisinopril stopped due to risk of recurrent dehydration/renal failure.   Chronic renal disease, stage 3, moderately decreased glomerular filtration rate (GFR) between 30-59  -Baseline creatinine 1.65.  Renal function almost back to baseline with IVF/holding Lisinopril.   Hypokalemia  - Supplemented.   Chronic gout  - Continue allopurinol.   Medical Consultants:    None.   Discharge Exam:   Filed Vitals:   07/05/14 0446  BP: 136/85  Pulse: 67  Temp: 98.4 F (36.9 C)  Resp: 18   Filed Vitals:   07/04/14 0648 07/04/14 1347 07/04/14 2114 07/05/14 0446  BP: 174/77 135/56 161/81 136/85  Pulse: 52 60 76 67  Temp: 97.8 F (36.6 C) 98.2 F (36.8 C) 97.2 F (36.2 C) 98.4 F (36.9 C)  TempSrc: Oral Oral Oral Oral  Resp: 18 18 20 18   Height:      Weight: 54.3 kg (119 lb 11.4 oz)     SpO2: 98% 96%  100%    Gen:  NAD, frail Cardiovascular:  RRR, No M/R/G Respiratory: Lungs CTAB Gastrointestinal: Abdomen soft, NT/ND with normal active bowel sounds. Extremities: No C/E/C   The results of significant diagnostics from this hospitalization (including imaging, microbiology, ancillary and laboratory) are listed below for reference.     Procedures and Diagnostic Studies:    None.  Labs:   Basic Metabolic Panel:  Recent Labs Lab 07/02/14 2215 07/03/14 0545 07/03/14 1215 07/04/14 0805  NA 159* 156* 155* 144  K 4.5 4.3 4.0 3.6*  CL 114* 114* 115* 107  CO2  --  28 27 26   GLUCOSE 196* 241* 228* 128*  BUN 77* 80* 69* 45*  CREATININE 2.90* 2.44* 2.19*  1.77*  CALCIUM  --  9.8 9.3 9.0   GFR Estimated Creatinine Clearance: 13.1 mL/min (by C-G formula based on Cr of 1.77).  CBC:  Recent Labs Lab 07/02/14 2215 07/02/14 2220  WBC  --  7.5  HGB 14.6 13.5  HCT 43.0 42.9  MCV  --  85.8  PLT  --  186   CBG:  Recent Labs Lab 07/04/14 1818 07/04/14 2112 07/05/14 0659 07/05/14 0800 07/05/14 1148  GLUCAP 201* 143* 146* 134* 130*   Lipid Profile  Recent Labs  07/03/14 0545  CHOL 163  HDL 53  LDLCALC 80  TRIG 150*  CHOLHDL 3.1   Microbiology Recent Results (from the past 240 hour(s))  MRSA PCR Screening     Status: None   Collection Time: 07/03/14  5:36 AM  Result Value Ref Range Status   MRSA by PCR NEGATIVE NEGATIVE Final    Comment:        The GeneXpert MRSA Assay (FDA approved for NASAL specimens only), is one component of a comprehensive MRSA colonization surveillance program. It is not intended to diagnose MRSA infection nor to guide or monitor treatment for MRSA infections.      Discharge Instructions:       Discharge Instructions    Call MD for:  extreme fatigue    Complete by:  As directed      Diet Carb Modified    Complete by:  As directed      Discharge instructions    Complete by:  As directed   You were cared for by Dr. Hillery Aldo  (a hospitalist) during your hospital stay. If you have any questions about your discharge medications or the care you received while you were in the hospital after you are discharged, you can call the unit and ask to speak with the hospitalist on call if the hospitalist that took care of you is not available. Once you are discharged, your primary care physician will handle any further medical issues. Please note that NO REFILLS for any discharge medications will be authorized once you are discharged, as it is imperative that you return to your primary care physician (or establish a relationship with a primary care physician if you do not have one) for your  aftercare needs so that they can reassess your need for medications and monitor your lab values.  Any outstanding tests can be reviewed by your PCP at your follow up visit.  It is also important to review any medicine changes with your PCP.  Please bring these d/c instructions with you to your next visit so your physician can review these changes with you.     Increase activity slowly    Complete by:  As directed      Walk with assistance    Complete by:  As directed      Walker     Complete by:  As directed             Medication List    STOP taking these medications        donepezil 10 MG tablet  Commonly known as:  ARICEPT  lisinopril 5 MG tablet  Commonly known as:  PRINIVIL,ZESTRIL     pravastatin 20 MG tablet  Commonly known as:  PRAVACHOL      TAKE these medications        allopurinol 100 MG tablet  Commonly known as:  ZYLOPRIM  Take 100 mg by mouth every other day.     amLODipine 5 MG tablet  Commonly known as:  NORVASC  Take 1 tablet (5 mg total) by mouth daily.     calcitonin (salmon) 200 UNIT/ACT nasal spray  Commonly known as:  MIACALCIN/FORTICAL  Place 1 spray into alternate nostrils daily.     calcium-vitamin D 500-200 MG-UNIT per tablet  Commonly known as:  OSCAL WITH D  Take 1 tablet by mouth 3 (three) times daily.     carboxymethylcellulose 0.5 % Soln  Commonly known as:  REFRESH PLUS  Place 1 drop into both eyes 4 (four) times daily.     COMBIGAN 0.2-0.5 % ophthalmic solution  Generic drug:  brimonidine-timolol  Place 1 drop into both eyes at bedtime.     diclofenac sodium 1 % Gel  Commonly known as:  VOLTAREN  Apply 2 g topically 4 (four) times daily.     multivitamins with iron Tabs tablet  Take 1 tablet by mouth every morning.     NAMENDA XR 28 MG Cp24  Generic drug:  Memantine HCl ER  Take 28 mg by mouth every morning.     scopolamine 1 MG/3DAYS  Commonly known as:  TRANSDERM-SCOP  Place 1 patch onto the skin every 3 (three)  days.     TWOCAL HN Liqd  Take 90 mLs by mouth 3 (three) times daily.       Follow-up Information    Follow up with REED, TIFFANY, DO. Schedule an appointment as soon as possible for a visit in 3 weeks.   Specialty:  Geriatric Medicine   Why:  Hospital follow up.   Contact information:   [address] [place] [address] [phone]        Time coordinating discharge: 35 minutes.  Signed:  RAMA,CHRISTINA  Pager [phone] [facility] 07/05/2014, 1:13 PM

## 2014-07-05 NOTE — Progress Notes (Signed)
Inpatient Diabetes Program Recommendations  AACE/ADA: New Consensus Statement on Inpatient Glycemic Control (2013)  Target Ranges:  Prepandial:   less than 140 mg/dL      Peak postprandial:   less than 180 mg/dL (1-2 hours)      Critically ill patients:  140 - 180 mg/dL     Results for [name], Vickie [name] (MRN [id number]) as of 07/05/2014 08:58  Ref. Range 07/04/2014 07:35 07/04/2014 12:32 07/04/2014 17:12 07/04/2014 17:39 07/04/2014 17:42 07/04/2014 18:18 07/04/2014 21:12  Glucose-Capillary Latest Range: 70-99 mg/dL 88 [id number] (H) 41 (LL) 38 (LL) 29 (LL) 201 (H) 143 (H)     Current Insulin Orders: Novolog Sensitive SSI   **Patient received 1 unit Novolog SSI yesterday at lunch for CBG of 134 mg/dl.    **Had severe Hypoglycemia by suppertime.    MD- Please consider d/c of Novolog Sensitive SSI for now but continue CBG checks     Will follow Ambrose FinlandJeannine Johnston Domanique Huesman RN, MSN, CDE Diabetes Coordinator Inpatient Diabetes Program Team Pager: [phone] (8a-10p)

## 2014-07-05 NOTE — Discharge Instructions (Signed)
Dehydration, Adult Dehydration is when you lose more fluids from the body than you take in. Vital organs like the kidneys, brain, and heart cannot function without a proper amount of fluids and salt. Any loss of fluids from the body can cause dehydration.  CAUSES   Vomiting.  Diarrhea.  Excessive sweating.  Excessive urine output.  Fever. SYMPTOMS  Mild dehydration  Thirst.  Dry lips.  Slightly dry mouth. Moderate dehydration  Very dry mouth.  Sunken eyes.  Skin does not bounce back quickly when lightly pinched and released.  Dark urine and decreased urine production.  Decreased tear production.  Headache. Severe dehydration  Very dry mouth.  Extreme thirst.  Rapid, weak pulse (more than 100 beats per minute at rest).  Cold hands and feet.  Not able to sweat in spite of heat and temperature.  Rapid breathing.  Blue lips.  Confusion and lethargy.  Difficulty being awakened.  Minimal urine production.  No tears. DIAGNOSIS  Your caregiver will diagnose dehydration based on your symptoms and your exam. Blood and urine tests will help confirm the diagnosis. The diagnostic evaluation should also identify the cause of dehydration. TREATMENT  Treatment of mild or moderate dehydration can often be done at home by increasing the amount of fluids that you drink. It is best to drink small amounts of fluid more often. Drinking too much at one time can make vomiting worse. Refer to the home care instructions below. Severe dehydration needs to be treated at the hospital where you will probably be given intravenous (IV) fluids that contain water and electrolytes. HOME CARE INSTRUCTIONS   Ask your caregiver about specific rehydration instructions.  Drink enough fluids to keep your urine clear or pale yellow.  Drink small amounts frequently if you have nausea and vomiting.  Eat as you normally do.  Avoid:  Foods or drinks high in sugar.  Carbonated  drinks.  Juice.  Extremely hot or cold fluids.  Drinks with caffeine.  Fatty, greasy foods.  Alcohol.  Tobacco.  Overeating.  Gelatin desserts.  Wash your hands well to avoid spreading bacteria and viruses.  Only take over-the-counter or prescription medicines for pain, discomfort, or fever as directed by your caregiver.  Ask your caregiver if you should continue all prescribed and over-the-counter medicines.  Keep all follow-up appointments with your caregiver. SEEK MEDICAL CARE IF:  You have abdominal pain and it increases or stays in one area (localizes).  You have a rash, stiff neck, or severe headache.  You are irritable, sleepy, or difficult to awaken.  You are weak, dizzy, or extremely thirsty. SEEK IMMEDIATE MEDICAL CARE IF:   You are unable to keep fluids down or you get worse despite treatment.  You have frequent episodes of vomiting or diarrhea.  You have blood or green matter (bile) in your vomit.  You have blood in your stool or your stool looks black and tarry.  You have not urinated in 6 to 8 hours, or you have only urinated a small amount of very dark urine.  You have a fever.  You faint. MAKE SURE YOU:   Understand these instructions.  Will watch your condition.  Will get help right away if you are not doing well or get worse. Document Released: 07/05/2005 Document Revised: 09/27/2011 Document Reviewed: 02/22/2011 ExitCare Patient Information 2015 [employer]. This information is not intended to replace advice given to you by your health care provider. Make sure you discuss any questions you have with your health care   provider.  

## 2014-07-05 NOTE — Care Management Note (Signed)
CARE MANAGEMENT NOTE 07/05/2014  Patient:  Vickie [name],Vickie [name]   Account Number:  [id number]  Date Initiated:  07/05/2014  Documentation initiated by:  Sandford CrazeLEMENTS,Shawnn Bouillon  Subjective/Objective Assessment:   80 yo admitted with Hypernatremia     Action/Plan:   [facility]   Anticipated DC Date:  07/05/2014   Anticipated DC Plan:  SKILLED NURSING FACILITY  In-house referral  Clinical Social Worker      DC Planning Services  CM consult      Choice offered to / List presented to:             Status of service:  In process, will continue to follow Medicare Important Message given?  YES (If response is "NO", the following Medicare IM given date fields will be blank) Date Medicare IM given:  07/05/2014 Medicare IM given by:  Sandford CrazeLEMENTS,Jerelyn Trimarco Date Additional Medicare IM given:   Additional Medicare IM given by:    Discharge Disposition:    Per UR Regulation:  Reviewed for med. necessity/level of care/duration of stay  If discussed at Long Length of Stay Meetings, dates discussed:    Comments:  07/05/14 Sandford Crazeora Koleton Duchemin RN,BSN,NCM [phone] Pt to DC back to SNF today.  No CM needs noted.

## 2014-07-05 NOTE — Progress Notes (Signed)
[facility].  CSW facilitated pt discharge needs including contacting facility, faxing pt discharge information via [facility], discussing with pt niece at bedside, providing RN phone number to call report, and arranging ambulance transportation via [facility].   Pt niece expressed concern about minimizing fall risk at SNF. CSW had connected pt niece with [facility] liaison with pt niece earlier this week. CSW notified [facility] and director of nursing planned to contact pt niece again to discuss.   CSW followed up with pt niece and pt niece confirmed that she received phone call from Interior and spatial designerdirector of nursing at [facility] and was able to further discuss her concerns with director of nursing.   No further social work needs identified at this time.  CSW signing off.   Vickie SpecterSuzanna Luverne [name], MSW, LCSW Clinical Social Work [phone]

## 2014-07-09 ENCOUNTER — Encounter: Payer: Self-pay | Admitting: Adult Health

## 2014-07-09 ENCOUNTER — Non-Acute Institutional Stay (SKILLED_NURSING_FACILITY): Payer: Medicare Other | Admitting: Adult Health

## 2014-07-09 DIAGNOSIS — I10 Essential (primary) hypertension: Secondary | ICD-10-CM

## 2014-07-09 DIAGNOSIS — M1A4720 Other secondary chronic gout, left ankle and foot, without tophus (tophi): Secondary | ICD-10-CM

## 2014-07-09 DIAGNOSIS — E785 Hyperlipidemia, unspecified: Secondary | ICD-10-CM

## 2014-07-09 DIAGNOSIS — M10472 Other secondary gout, left ankle and foot: Secondary | ICD-10-CM

## 2014-07-09 DIAGNOSIS — M810 Age-related osteoporosis without current pathological fracture: Secondary | ICD-10-CM

## 2014-07-09 DIAGNOSIS — N058 Unspecified nephritic syndrome with other morphologic changes: Secondary | ICD-10-CM

## 2014-07-09 DIAGNOSIS — F0280 Dementia in other diseases classified elsewhere without behavioral disturbance: Secondary | ICD-10-CM

## 2014-07-09 DIAGNOSIS — N183 Chronic kidney disease, stage 3 unspecified: Secondary | ICD-10-CM

## 2014-07-09 DIAGNOSIS — R609 Edema, unspecified: Secondary | ICD-10-CM

## 2014-07-09 DIAGNOSIS — E1129 Type 2 diabetes mellitus with other diabetic kidney complication: Secondary | ICD-10-CM

## 2014-07-09 DIAGNOSIS — G309 Alzheimer's disease, unspecified: Secondary | ICD-10-CM

## 2014-07-09 MED ORDER — MEMANTINE HCL ER 14 MG PO CP24: 14.0000 mg | ORAL_CAPSULE | Freq: Every day | ORAL | Status: AC

## 2014-07-09 NOTE — Progress Notes (Signed)
Patient ID: Vickie [name], female   DOB: 06/29/1913, 80 y.o.   MRN: [id number]  [facility]     CODE STATUS:  DNR; NO TUBE FEEDING   Allergies  Allergen Reactions  . Beta Adrenergic Blockers     Because of first degree AV block       Chief Complaint  Patient presents with  . Hospitalization Follow-up    HPI:  She is a long term resident of this facility who hospitalized after a fall where there was a possible head injury. She was found at the hospital to have worsening renal function with a worsening burn/creat and elevated sodium. She was treated with ivf. Her sodium did normalize. She is unable to participate in the hpi or ros. There are no nursing concerns at this time.    Past Medical History  Diagnosis Date  . Diabetes mellitus type II, controlled   . Hyperlipidemia LDL goal < 100   . Gout   . Alzheimer's disease   . Hypertension, benign essential, goal below 140/90   . Senile osteoporosis   . Edema   . Glaucoma   . GERD (gastroesophageal reflux disease)     No past surgical history on file.  VITAL SIGNS BP 137/88 mmHg  Pulse 68  Ht 5\' 1"  (1.549 m)  Wt 119 lb (53.978 kg)  BMI 22.50 kg/m2   Outpatient Encounter Prescriptions as of 07/09/2014  Medication Sig  . allopurinol (ZYLOPRIM) 100 MG tablet Take 100 mg by mouth every other day.   Marland Kitchen. amLODipine (NORVASC) 5 MG tablet Take 1 tablet (5 mg total) by mouth daily.  . brimonidine-timolol (COMBIGAN) 0.2-0.5 % ophthalmic solution Place 1 drop into both eyes at bedtime.  . calcitonin, salmon, (MIACALCIN/FORTICAL) 200 UNIT/ACT nasal spray Place 1 spray into alternate nostrils daily.  . calcium-vitamin D (OSCAL WITH D) 500-200 MG-UNIT per tablet Take 1 tablet by mouth 3 (three) times daily.  . carboxymethylcellulose (REFRESH PLUS) 0.5 % SOLN Place 1 drop into both eyes 4 (four) times daily.  . diclofenac sodium (VOLTAREN) 1 % GEL Apply 2 g topically 4 (four) times daily.  . Memantine HCl ER (NAMENDA XR) 28 MG  CP24 Take 28 mg by mouth every morning.   . Multiple Vitamins-Iron (MULTIVITAMINS WITH IRON) TABS tablet Take 1 tablet by mouth every morning.   . Nutritional Supplements (TWOCAL HN) LIQD Take 90 mLs by mouth 3 (three) times daily.  Marland Kitchen. scopolamine (TRANSDERM-SCOP) 1 MG/3DAYS Place 1 patch onto the skin every 3 (three) days.     SIGNIFICANT DIAGNOSTIC EXAMS   04-01-14: ABI: moderate PAD    LABS REVIEWED:   01-24-14: wbc 4.9; hgb 11.1; hct 37.5; mcv 86.8; plt 259; glucose 219; bun 28.6; creat 1.64; k+4.9; na++142; liver normal albumin 3.6 04-05-14: hgb a1c 7.1; pre-albumin 27  05-31-14: chol 191; ldl 102; trig 124  07-02-14: wbc 7.5; hgb 13.5; hct 42.9; mcv 85.8 plt 186; glucose 196; bun 77; creat 2.9; k+ 4.5; na++159 07-03-14: glucose 241; bun 80; creat 2.44; k+ 4.3; na++156; chol 163; ldl 80; trig 150 07-04-14: glucose 128; bun 45; creat 1.77; k+3.6; na++144      Review of Systems  Unable to perform ROS    Physical Exam  Constitutional: No distress.  thin  Neck: Neck supple. No JVD present. No thyromegaly present.  Cardiovascular: Normal rate, regular rhythm and intact distal pulses.   Respiratory: Effort normal and breath sounds normal. No respiratory distress.  GI: Soft. Bowel sounds are normal. She exhibits no distension.  There is no tenderness.  Musculoskeletal: She exhibits no edema.  Is able to move all extremities   Neurological: She is alert.  Skin: Skin is warm and dry. She is not diaphoretic Bilateral great toes: tips with necrotic areas no signs of infection present     ASSESSMENT/ PLAN:  1. Alzheimer's disease: she does continue to decline. She has lost weight her current weight is 119 pounds. Due to her renal function will lower her namenda xr to 14 mg daily and will monitor her status.   2. Hypertension: will continue norvasc 5 mg daily   3. Osteoarthritis: will continue voltaren gel 2 gm to left shoulder four times daily;   4. Diabetes: her hgb a1c is  7.1; is not on medications will not make changes will monitor her status.   5. Edema: her lasix was stopped due to her poor intake; she remains stable will not make changes  6. Osteoporosis: will continue miacalcin spray and ca++500/200 tid  7. Dyslipidemia: her pravachol was stopped due to her poor intake; will not make changes  8. Gout: will continue allopurinol 100 mg daily no recent flares  9. FTT: she continues to decline; will continue to provide nutritional support per facility protocol   Will check bmp next week  Time spent with patient 50 minutes.       Synthia Innocenteborah Awad Gladd NP [facility]  Contact [phone] Monday through Friday 8am- 5pm  After hours call [phone]

## 2014-07-16 ENCOUNTER — Non-Acute Institutional Stay (SKILLED_NURSING_FACILITY): Payer: Medicare Other | Admitting: Internal Medicine

## 2014-07-16 ENCOUNTER — Encounter: Payer: Self-pay | Admitting: Internal Medicine

## 2014-07-16 DIAGNOSIS — S91302A Unspecified open wound, left foot, initial encounter: Secondary | ICD-10-CM | POA: Insufficient documentation

## 2014-07-16 DIAGNOSIS — N058 Unspecified nephritic syndrome with other morphologic changes: Secondary | ICD-10-CM

## 2014-07-16 DIAGNOSIS — F0280 Dementia in other diseases classified elsewhere without behavioral disturbance: Secondary | ICD-10-CM

## 2014-07-16 DIAGNOSIS — E870 Hyperosmolality and hypernatremia: Secondary | ICD-10-CM

## 2014-07-16 DIAGNOSIS — E1129 Type 2 diabetes mellitus with other diabetic kidney complication: Secondary | ICD-10-CM

## 2014-07-16 DIAGNOSIS — E785 Hyperlipidemia, unspecified: Secondary | ICD-10-CM

## 2014-07-16 DIAGNOSIS — N183 Chronic kidney disease, stage 3 unspecified: Secondary | ICD-10-CM

## 2014-07-16 DIAGNOSIS — G309 Alzheimer's disease, unspecified: Secondary | ICD-10-CM

## 2014-07-16 DIAGNOSIS — S91302D Unspecified open wound, left foot, subsequent encounter: Secondary | ICD-10-CM

## 2014-07-16 NOTE — Progress Notes (Signed)
Patient ID: Michail Sermon, female   DOB: 1912/11/22, 80 y.o.   MRN: [id number]    HISTORY AND PHYSICAL  Location:  [facility]    Place of Service: SNF [id number])   Extended Emergency Contact Information Primary Emergency Contact: Smith,Roslyn Address: [address] [place], [place] [address] [place] Phone: [phone] Mobile Phone: [phone] Relation: Niece  Advanced Directive information  DNR; MOST form on chart  Chief Complaint  Patient presents with  . Readmit To SNF    Hypernatremia, dehydration, elevated Cr with hx stage 3 CKD, Dm type II with renal disease, Alzheimer's disease, hyperlipidemia,edem, gout, left heel wound    HPI:  80 yo female seen today for readmission into SNF from hospital for above. She has no concerns. Namenda Xr reduced. She does not have CBGs and refuses to have them done. Last check revealed level of 99. She does not take any medications for diabetes. No new numbness or tingling.   No nursing concerns  Past Medical History  Diagnosis Date  . Diabetes mellitus type II, controlled   . Hyperlipidemia LDL goal < 100   . Gout   . Alzheimer's disease   . Hypertension, benign essential, goal below 140/90   . Senile osteoporosis   . Edema   . Glaucoma   . GERD (gastroesophageal reflux disease)     No past surgical history on file.  Patient Care Team: Shea Evans, DO as PCP - General (Internal Medicine) Gerlene Fee, NP as Nurse Practitioner (Nurse Practitioner)  History   Social History  . Marital Status: Widowed    Spouse Name: N/A    Number of Children: N/A  . Years of Education: N/A   Occupational History  . Not on file.   Social History Main Topics  . Smoking status: Never Smoker   . Smokeless tobacco: Never Used  . Alcohol Use: No  . Drug Use: No  . Sexual Activity: No   Other Topics Concern  . Not on file   Social History Narrative     reports that she has never smoked. She has never used smokeless tobacco. She reports that she does not drink alcohol or use illicit drugs.  No family history on file. No family status information on file.    Immunization History  Administered Date(s) Administered  . Influenza Whole 04/30/2013  . Influenza-Unspecified 05/01/2014  . Pneumococcal-Unspecified 05/19/2006    Allergies  Allergen Reactions  . Beta Adrenergic Blockers     Because of first degree AV block    Medications: Patient's Medications  New Prescriptions   No medications on file  Previous Medications   ALLOPURINOL (ZYLOPRIM) 100 MG TABLET    Take 100 mg by mouth every other day.    AMLODIPINE (NORVASC) 5 MG TABLET    Take 1 tablet (5 mg total) by mouth daily.   BRIMONIDINE-TIMOLOL (COMBIGAN) 0.2-0.5 % OPHTHALMIC SOLUTION    Place 1 drop into both eyes at bedtime.   CALCITONIN, SALMON, (MIACALCIN/FORTICAL) 200 UNIT/ACT NASAL SPRAY    Place 1 spray into alternate nostrils daily.   CALCIUM-VITAMIN D (OSCAL WITH D) 500-200 MG-UNIT PER TABLET    Take 1 tablet by mouth 3 (three) times daily.   CARBOXYMETHYLCELLULOSE (REFRESH PLUS) 0.5 % SOLN    Place 1 drop into both eyes 4 (four) times daily.   DICLOFENAC SODIUM (VOLTAREN) 1 % GEL    Apply 2  g topically 4 (four) times daily.   MEMANTINE HCL ER (NAMENDA XR) 14 MG CP24    Take 1 tablet (14 mg total) by mouth daily.   MULTIPLE VITAMINS-IRON (MULTIVITAMINS WITH IRON) TABS TABLET    Take 1 tablet by mouth every morning.    NUTRITIONAL SUPPLEMENTS (TWOCAL HN) LIQD    Take 90 mLs by mouth 3 (three) times daily.   SCOPOLAMINE (TRANSDERM-SCOP) 1 MG/3DAYS    Place 1 patch onto the skin every 3 (three) days.  Modified Medications   No medications on file  Discontinued Medications   No medications on file    Review of Systems  As above. Unable to obtain complete ROS due to mental status  Filed Vitals:   07/16/14 0016  BP: 137/88  Pulse: 68  Temp: 98.1 F (36.7 C)    Weight: 119 lb (53.978 kg)  SpO2: 98%   Body mass index is 22.5 kg/(m^2).  Physical Exam CONSTITUTIONAL: Looks well in NAD. Awake and alert. HEENT: PERRLA. Oropharynx clear and without exudate. MMM NECK: Supple. Nontender. No palpable cervical or supraclavicular lymph nodes. No carotid bruit b/l.  CVS: Regular rate without murmur, gallop or rub. LUNGS: CTA b/l no wheezing, rales or rhonchi. ABDOMEN: Bowel sounds present x 4. Soft, nontender, nondistended. No palpable mass or bruit EXTREMITIES: No edema b/l. Distal pulses palpable. No calf tenderness. B/l 1st toe plantar surface pressure sore, dry PSYCH: cooperative   Labs reviewed: Admission on 07/02/2014, Discharged on 07/05/2014  Component Date Value Ref Range Status  . Color, Urine 07/02/2014 YELLOW  YELLOW Final  . APPearance 07/02/2014 CLEAR  CLEAR Final  . Specific Gravity, Urine 07/02/2014 1.017  1.005 - 1.030 Final  . pH 07/02/2014 5.0  5.0 - 8.0 Final  . Glucose, UA 07/02/2014 NEGATIVE  NEGATIVE mg/dL Final  . Hgb urine dipstick 07/02/2014 NEGATIVE  NEGATIVE Final  . Bilirubin Urine 07/02/2014 SMALL* NEGATIVE Final  . Ketones, ur 07/02/2014 15* NEGATIVE mg/dL Final  . Protein, ur 07/02/2014 NEGATIVE  NEGATIVE mg/dL Final  . Urobilinogen, UA 07/02/2014 1.0  0.0 - 1.0 mg/dL Final  . Nitrite 07/02/2014 NEGATIVE  NEGATIVE Final  . Leukocytes, UA 07/02/2014 NEGATIVE  NEGATIVE Final   MICROSCOPIC NOT DONE ON URINES WITH NEGATIVE PROTEIN, BLOOD, LEUKOCYTES, NITRITE, OR GLUCOSE <1000 mg/dL.  Marland Kitchen Sodium 07/02/2014 159* 137 - 147 mEq/L Final  . Potassium 07/02/2014 4.5  3.7 - 5.3 mEq/L Final  . Chloride 07/02/2014 114* 96 - 112 mEq/L Final  . BUN 07/02/2014 77* 6 - 23 mg/dL Final  . Creatinine, Ser 07/02/2014 2.90* 0.50 - 1.10 mg/dL Final  . Glucose, Bld 07/02/2014 196* 70 - 99 mg/dL Final  . Calcium, Ion 07/02/2014 1.35* 1.13 - 1.30 mmol/L Final  . TCO2 07/02/2014 28  0 - 100 mmol/L Final  . Hemoglobin 07/02/2014 14.6  12.0 -  15.0 g/dL Final  . HCT 07/02/2014 43.0  36.0 - 46.0 % Final  . WBC 07/02/2014 7.5  4.0 - 10.5 K/uL Final  . RBC 07/02/2014 5.00  3.87 - 5.11 MIL/uL Final  . Hemoglobin 07/02/2014 13.5  12.0 - 15.0 g/dL Final  . HCT 07/02/2014 42.9  36.0 - 46.0 % Final  . MCV 07/02/2014 85.8  78.0 - 100.0 fL Final  . MCH 07/02/2014 27.0  26.0 - 34.0 pg Final  . MCHC 07/02/2014 31.5  30.0 - 36.0 g/dL Final  . RDW 07/02/2014 15.6* 11.5 - 15.5 % Final  . Platelets 07/02/2014 186  150 - 400 K/uL Final  .  Osmolality, Ur 07/03/2014 575  390 - 1090 mOsm/kg Final   Performed at [facility]  . Sodium, Ur 07/03/2014 24   Final   Performed at [facility]  . Sodium 07/03/2014 156* 137 - 147 mEq/L Final  . Potassium 07/03/2014 4.3  3.7 - 5.3 mEq/L Final  . Chloride 07/03/2014 114* 96 - 112 mEq/L Final  . CO2 07/03/2014 28  19 - 32 mEq/L Final  . Glucose, Bld 07/03/2014 241* 70 - 99 mg/dL Final  . BUN 07/03/2014 80* 6 - 23 mg/dL Final  . Creatinine, Ser 07/03/2014 2.44* 0.50 - 1.10 mg/dL Final  . Calcium 07/03/2014 9.8  8.4 - 10.5 mg/dL Final  . GFR calc non Af Amer 07/03/2014 15* >90 mL/min Final  . GFR calc Af Amer 07/03/2014 18* >90 mL/min Final   Comment: (NOTE) The eGFR has been calculated using the CKD EPI equation. This calculation has not been validated in all clinical situations. eGFR's persistently <90 mL/min signify possible Chronic Kidney Disease.   . Anion gap 07/03/2014 14  5 - 15 Final  . Creatinine, Urine 07/03/2014 125.0   Final   Comment: No reference range established. Performed at [facility]   . Urea Nitrogen, Ur 07/03/2014 1157   Final   Comment: (NOTE)   No established reference range for random urine. Performed at [facility]   . Cholesterol 07/03/2014 163  0 - 200 mg/dL Final  . Triglycerides 07/03/2014 150* <150 mg/dL Final  . HDL 07/03/2014 53  >39 mg/dL Final  . Total CHOL/HDL Ratio 07/03/2014 3.1   Final  . VLDL 07/03/2014 30  0 - 40 mg/dL  Final  . LDL Cholesterol 07/03/2014 80  0 - 99 mg/dL Final   Comment:        Total Cholesterol/HDL:CHD Risk Coronary Heart Disease Risk Table                     Men   Women  1/2 Average Risk   3.4   3.3  Average Risk       5.0   4.4  2 X Average Risk   9.6   7.1  3 X Average Risk  23.4   11.0        Use the calculated Patient Ratio above and the CHD Risk Table to determine the patient's CHD Risk.        ATP III CLASSIFICATION (LDL):  <100     mg/dL   Optimal  100-129  mg/dL   Near or Above                    Optimal  130-159  mg/dL   Borderline  160-189  mg/dL   High  >190     mg/dL   Very High Performed at [facility]   . MRSA by PCR 07/03/2014 NEGATIVE  NEGATIVE Final   Comment:        The GeneXpert MRSA Assay (FDA approved for NASAL specimens only), is one component of a comprehensive MRSA colonization surveillance program. It is not intended to diagnose MRSA infection nor to guide or monitor treatment for MRSA infections.   . Sodium 07/03/2014 155* 137 - 147 mEq/L Final  . Potassium 07/03/2014 4.0  3.7 - 5.3 mEq/L Final  . Chloride 07/03/2014 115* 96 - 112 mEq/L Final  . CO2 07/03/2014 27  19 - 32 mEq/L Final  . Glucose, Bld 07/03/2014 228* 70 - 99 mg/dL  Final  . BUN 07/03/2014 69* 6 - 23 mg/dL Final  . Creatinine, Ser 07/03/2014 2.19* 0.50 - 1.10 mg/dL Final  . Calcium 07/03/2014 9.3  8.4 - 10.5 mg/dL Final  . GFR calc non Af Amer 07/03/2014 17* >90 mL/min Final  . GFR calc Af Amer 07/03/2014 20* >90 mL/min Final   Comment: (NOTE) The eGFR has been calculated using the CKD EPI equation. This calculation has not been validated in all clinical situations. eGFR's persistently <90 mL/min signify possible Chronic Kidney Disease.   . Anion gap 07/03/2014 13  5 - 15 Final  . Glucose-Capillary 07/03/2014 184* 70 - 99 mg/dL Final  . Comment 1 07/03/2014 Documented in Chart   Final  . Comment 2 07/03/2014 Notify RN   Final  . Glucose-Capillary 07/03/2014  177* 70 - 99 mg/dL Final  . Comment 1 07/03/2014 Documented in Chart   Final  . Comment 2 07/03/2014 Notify RN   Final  . Glucose-Capillary 07/03/2014 124* 70 - 99 mg/dL Final  . Comment 1 07/03/2014 Documented in Chart   Final  . Comment 2 07/03/2014 Notify RN   Final  . Sodium 07/04/2014 144  137 - 147 mEq/L Final   Comment: DELTA CHECK NOTED REPEATED TO VERIFY   . Potassium 07/04/2014 3.6* 3.7 - 5.3 mEq/L Final  . Chloride 07/04/2014 107  96 - 112 mEq/L Final  . CO2 07/04/2014 26  19 - 32 mEq/L Final  . Glucose, Bld 07/04/2014 128* 70 - 99 mg/dL Final  . BUN 07/04/2014 45* 6 - 23 mg/dL Final   Comment: DELTA CHECK NOTED REPEATED TO VERIFY   . Creatinine, Ser 07/04/2014 1.77* 0.50 - 1.10 mg/dL Final  . Calcium 07/04/2014 9.0  8.4 - 10.5 mg/dL Final  . GFR calc non Af Amer 07/04/2014 23* >90 mL/min Final  . GFR calc Af Amer 07/04/2014 26* >90 mL/min Final   Comment: (NOTE) The eGFR has been calculated using the CKD EPI equation. This calculation has not been validated in all clinical situations. eGFR's persistently <90 mL/min signify possible Chronic Kidney Disease.   . Anion gap 07/04/2014 11  5 - 15 Final  . Glucose-Capillary 07/04/2014 88  70 - 99 mg/dL Final  . Comment 1 07/04/2014 Documented in Chart   Final  . Comment 2 07/04/2014 Notify RN   Final  . Glucose-Capillary 07/04/2014 134* 70 - 99 mg/dL Final  . Comment 1 07/04/2014 Documented in Chart   Final  . Comment 2 07/04/2014 Notify RN   Final  . Glucose-Capillary 07/04/2014 41* 70 - 99 mg/dL Final  . Glucose-Capillary 07/04/2014 38* 70 - 99 mg/dL Final  . Comment 1 07/04/2014 Repeat Test   Final  . Glucose-Capillary 07/04/2014 29* 70 - 99 mg/dL Final  . Glucose-Capillary 07/04/2014 201* 70 - 99 mg/dL Final  . Glucose-Capillary 07/04/2014 143* 70 - 99 mg/dL Final  . Glucose-Capillary 07/05/2014 146* 70 - 99 mg/dL Final  . Glucose-Capillary 07/05/2014 134* 70 - 99 mg/dL Final  . Glucose-Capillary 07/05/2014 130* 70  - 99 mg/dL Final  Nursing Home on 05/29/2014  Component Date Value Ref Range Status  . Hemoglobin 01/24/2014 11.1* 12.0 - 16.0 g/dL Final  . HCT 01/24/2014 38  36 - 46 % Final  . Platelets 01/24/2014 259  150 - 399 K/L Final  . WBC 01/24/2014 4.9   Final  . Glucose 01/24/2014 219   Final  . BUN 01/24/2014 29* 4 - 21 mg/dL Final  . Creatinine 01/24/2014 1.6* 0.5 -  1.1 mg/dL Final  . Potassium 01/24/2014 4.9  3.4 - 5.3 mmol/L Final  . Sodium 01/24/2014 142  137 - 147 mmol/L Final  . Hgb A1c MFr Bld 04/05/2014 7.1* 4.0 - 6.0 % Final    No results found.   Assessment/Plan    ICD-9-CM ICD-10-CM   1. Hypernatremia secondary to dehydration 276.0 E87.0   2. Chronic renal disease, stage 3, moderately decreased glomerular filtration rate (GFR) between 30-59 mL/min/1.73 square meter 585.3 N18.3   3. DM (diabetes mellitus) type II controlled with renal manifestation 250.40 E11.29     N05.8   4. Alzheimer's disease 331.0 G30.9   5. Open wound of left heel, subsequent encounter V58.89 S91.302D    892.0    6. Hyperlipidemia with target LDL less than 100 272.4 E78.5    - namenda dose reduced. Continue all other medications as Rx  - check BMP on 07/16/14  - continue OT/ST as ordered  - continue wound care to foot  - she is a long term resident  Chestine Belknap S. Perlie Gold  [facility] [address] [place], [place] [address] [phone] Office (Wednesdays and Fridays 8 AM - 5 PM) [phone] Cell (Monday-Friday 8 AM - 5 PM)

## 2014-07-18 LAB — BASIC METABOLIC PANEL
BUN: 22 mg/dL — AB (ref 4–21)
Creatinine: 1.4 mg/dL — AB (ref 0.5–1.1)
Glucose: 358 mg/dL
Potassium: 4.6 mmol/L (ref 3.4–5.3)
SODIUM: 139 mmol/L (ref 137–147)

## 2014-08-05 ENCOUNTER — Non-Acute Institutional Stay (SKILLED_NURSING_FACILITY): Payer: Medicare Other | Admitting: Internal Medicine

## 2014-08-05 DIAGNOSIS — B370 Candidal stomatitis: Secondary | ICD-10-CM

## 2014-08-05 DIAGNOSIS — E119 Type 2 diabetes mellitus without complications: Secondary | ICD-10-CM

## 2014-08-10 ENCOUNTER — Encounter: Payer: Self-pay | Admitting: Internal Medicine

## 2014-08-10 NOTE — Progress Notes (Signed)
Patient ID: Vickie [name], female   DOB: [birth year], 81 y.o.   MRN: [id number]    [place]     Place of Service: SNF (31)    Allergies  Allergen Reactions  . Beta Adrenergic Blockers     Because of first degree AV block    Chief Complaint  Patient presents with  . Acute Visit    thrush    HPI:  81 yo long term resident seen today for above. Nursing reports a 3 day hx of coating on her tongue that does not scrap off. Pt c/o that there is something on her tongue. No f/c. No dysphagia. No change in appetite. No N/V. No significant fluctuations of CBGs. Hx obtained from nursing due to pt's dementia  Medications: Patient's Medications  New Prescriptions   No medications on file  Previous Medications   ALLOPURINOL (ZYLOPRIM) 100 MG TABLET    Take 100 mg by mouth every other day.    AMLODIPINE (NORVASC) 5 MG TABLET    Take 1 tablet (5 mg total) by mouth daily.   BRIMONIDINE-TIMOLOL (COMBIGAN) 0.2-0.5 % OPHTHALMIC SOLUTION    Place 1 drop into both eyes at bedtime.   CALCITONIN, SALMON, (MIACALCIN/FORTICAL) 200 UNIT/ACT NASAL SPRAY    Place 1 spray into alternate nostrils daily.   CALCIUM-VITAMIN D (OSCAL WITH D) 500-200 MG-UNIT PER TABLET    Take 1 tablet by mouth 3 (three) times daily.   CARBOXYMETHYLCELLULOSE (REFRESH PLUS) 0.5 % SOLN    Place 1 drop into both eyes 4 (four) times daily.   DICLOFENAC SODIUM (VOLTAREN) 1 % GEL    Apply 2 g topically 4 (four) times daily.   MEMANTINE HCL ER (NAMENDA XR) 14 MG CP24    Take 1 tablet (14 mg total) by mouth daily.   MULTIPLE VITAMINS-IRON (MULTIVITAMINS WITH IRON) TABS TABLET    Take 1 tablet by mouth every morning.    NUTRITIONAL SUPPLEMENTS (TWOCAL HN) LIQD    Take 90 mLs by mouth 3 (three) times daily.   SCOPOLAMINE (TRANSDERM-SCOP) 1 MG/3DAYS    Place 1 patch onto the skin every 3 (three) days.  Modified Medications   No medications on file  Discontinued Medications   No medications on file      Review of Systems  As above. Unable to obtain further ROS due to pt's dementia  Filed Vitals:   08/05/14 2110  BP: 137/88  Pulse: 68  Temp: 98.1 F (36.7 C)   There is no weight on file to calculate BMI.  Physical Exam GEN: looks frail in NAD HEENT: PERRL. Oropharynx red but no exudate. Tongue coated with white thick plaque. MMM NECK:NT and no palpable lymph nodes  Labs reviewed: Admission on 07/02/2014, Discharged on 07/05/2014  Component Date Value Ref Range Status  . Color, Urine 07/02/2014 YELLOW  YELLOW Final  . APPearance 07/02/2014 CLEAR  CLEAR Final  . Specific Gravity, Urine 07/02/2014 1.017  1.005 - 1.030 Final  . pH 07/02/2014 5.0  5.0 - 8.0 Final  . Glucose, UA 07/02/2014 NEGATIVE  NEGATIVE mg/dL Final  . Hgb urine dipstick 07/02/2014 NEGATIVE  NEGATIVE Final  . Bilirubin Urine 07/02/2014 SMALL* NEGATIVE Final  . Ketones, ur 07/02/2014 15* NEGATIVE mg/dL Final  . Protein, ur 07/02/2014 NEGATIVE  NEGATIVE mg/dL Final  . Urobilinogen, UA 07/02/2014 1.0  0.0 - 1.0 mg/dL Final  . Nitrite 07/02/2014 NEGATIVE  NEGATIVE Final  . Leukocytes, UA 07/02/2014 NEGATIVE  NEGATIVE Final   MICROSCOPIC NOT DONE ON  URINES WITH NEGATIVE PROTEIN, BLOOD, LEUKOCYTES, NITRITE, OR GLUCOSE <1000 mg/dL.  Marland Kitchen Sodium 07/02/2014 159* 137 - 147 mEq/L Final  . Potassium 07/02/2014 4.5  3.7 - 5.3 mEq/L Final  . Chloride 07/02/2014 114* 96 - 112 mEq/L Final  . BUN 07/02/2014 77* 6 - 23 mg/dL Final  . Creatinine, Ser 07/02/2014 2.90* 0.50 - 1.10 mg/dL Final  . Glucose, Bld 07/02/2014 196* 70 - 99 mg/dL Final  . Calcium, Ion 07/02/2014 1.35* 1.13 - 1.30 mmol/L Final  . TCO2 07/02/2014 28  0 - 100 mmol/L Final  . Hemoglobin 07/02/2014 14.6  12.0 - 15.0 g/dL Final  . HCT 07/02/2014 43.0  36.0 - 46.0 % Final  . WBC 07/02/2014 7.5  4.0 - 10.5 K/uL Final  . RBC 07/02/2014 5.00  3.87 - 5.11 MIL/uL Final  . Hemoglobin 07/02/2014 13.5  12.0 - 15.0 g/dL Final  . HCT 07/02/2014 42.9  36.0 - 46.0  % Final  . MCV 07/02/2014 85.8  78.0 - 100.0 fL Final  . MCH 07/02/2014 27.0  26.0 - 34.0 pg Final  . MCHC 07/02/2014 31.5  30.0 - 36.0 g/dL Final  . RDW 07/02/2014 15.6* 11.5 - 15.5 % Final  . Platelets 07/02/2014 186  150 - 400 K/uL Final  . Osmolality, Ur 07/03/2014 575  390 - 1090 mOsm/kg Final   Performed at [facility]  . Sodium, Ur 07/03/2014 24   Final   Performed at [facility]  . Sodium 07/03/2014 156* 137 - 147 mEq/L Final  . Potassium 07/03/2014 4.3  3.7 - 5.3 mEq/L Final  . Chloride 07/03/2014 114* 96 - 112 mEq/L Final  . CO2 07/03/2014 28  19 - 32 mEq/L Final  . Glucose, Bld 07/03/2014 241* 70 - 99 mg/dL Final  . BUN 07/03/2014 80* 6 - 23 mg/dL Final  . Creatinine, Ser 07/03/2014 2.44* 0.50 - 1.10 mg/dL Final  . Calcium 07/03/2014 9.8  8.4 - 10.5 mg/dL Final  . GFR calc non Af Amer 07/03/2014 15* >90 mL/min Final  . GFR calc Af Amer 07/03/2014 18* >90 mL/min Final   Comment: (NOTE) The eGFR has been calculated using the CKD EPI equation. This calculation has not been validated in all clinical situations. eGFR's persistently <90 mL/min signify possible Chronic Kidney Disease.   . Anion gap 07/03/2014 14  5 - 15 Final  . Creatinine, Urine 07/03/2014 125.0   Final   Comment: No reference range established. Performed at [facility]   . Urea Nitrogen, Ur 07/03/2014 1157   Final   Comment: (NOTE)   No established reference range for random urine. Performed at [facility]   . Cholesterol 07/03/2014 163  0 - 200 mg/dL Final  . Triglycerides 07/03/2014 150* <150 mg/dL Final  . HDL 07/03/2014 53  >39 mg/dL Final  . Total CHOL/HDL Ratio 07/03/2014 3.1   Final  . VLDL 07/03/2014 30  0 - 40 mg/dL Final  . LDL Cholesterol 07/03/2014 80  0 - 99 mg/dL Final   Comment:        Total Cholesterol/HDL:CHD Risk Coronary Heart Disease Risk Table                     Men   Women  1/2 Average Risk   3.4   3.3  Average Risk       5.0   4.4  2 X  Average Risk   9.6   7.1  3 X Average Risk  23.4   11.0        Use the calculated Patient Ratio above and the CHD Risk Table to determine the patient's CHD Risk.        ATP III CLASSIFICATION (LDL):  <100     mg/dL   Optimal  100-129  mg/dL   Near or Above                    Optimal  130-159  mg/dL   Borderline  160-189  mg/dL   High  >190     mg/dL   Very High Performed at [facility]   . MRSA by PCR 07/03/2014 NEGATIVE  NEGATIVE Final   Comment:        The GeneXpert MRSA Assay (FDA approved for NASAL specimens only), is one component of a comprehensive MRSA colonization surveillance program. It is not intended to diagnose MRSA infection nor to guide or monitor treatment for MRSA infections.   . Sodium 07/03/2014 155* 137 - 147 mEq/L Final  . Potassium 07/03/2014 4.0  3.7 - 5.3 mEq/L Final  . Chloride 07/03/2014 115* 96 - 112 mEq/L Final  . CO2 07/03/2014 27  19 - 32 mEq/L Final  . Glucose, Bld 07/03/2014 228* 70 - 99 mg/dL Final  . BUN 07/03/2014 69* 6 - 23 mg/dL Final  . Creatinine, Ser 07/03/2014 2.19* 0.50 - 1.10 mg/dL Final  . Calcium 07/03/2014 9.3  8.4 - 10.5 mg/dL Final  . GFR calc non Af Amer 07/03/2014 17* >90 mL/min Final  . GFR calc Af Amer 07/03/2014 20* >90 mL/min Final   Comment: (NOTE) The eGFR has been calculated using the CKD EPI equation. This calculation has not been validated in all clinical situations. eGFR's persistently <90 mL/min signify possible Chronic Kidney Disease.   . Anion gap 07/03/2014 13  5 - 15 Final  . Glucose-Capillary 07/03/2014 184* 70 - 99 mg/dL Final  . Comment 1 07/03/2014 Documented in Chart   Final  . Comment 2 07/03/2014 Notify RN   Final  . Glucose-Capillary 07/03/2014 177* 70 - 99 mg/dL Final  . Comment 1 07/03/2014 Documented in Chart   Final  . Comment 2 07/03/2014 Notify RN   Final  . Glucose-Capillary 07/03/2014 124* 70 - 99 mg/dL Final  . Comment 1 07/03/2014 Documented in Chart   Final  . Comment 2  07/03/2014 Notify RN   Final  . Sodium 07/04/2014 144  137 - 147 mEq/L Final   Comment: DELTA CHECK NOTED REPEATED TO VERIFY   . Potassium 07/04/2014 3.6* 3.7 - 5.3 mEq/L Final  . Chloride 07/04/2014 107  96 - 112 mEq/L Final  . CO2 07/04/2014 26  19 - 32 mEq/L Final  . Glucose, Bld 07/04/2014 128* 70 - 99 mg/dL Final  . BUN 07/04/2014 45* 6 - 23 mg/dL Final   Comment: DELTA CHECK NOTED REPEATED TO VERIFY   . Creatinine, Ser 07/04/2014 1.77* 0.50 - 1.10 mg/dL Final  . Calcium 07/04/2014 9.0  8.4 - 10.5 mg/dL Final  . GFR calc non Af Amer 07/04/2014 23* >90 mL/min Final  . GFR calc Af Amer 07/04/2014 26* >90 mL/min Final   Comment: (NOTE) The eGFR has been calculated using the CKD EPI equation. This calculation has not been validated in all clinical situations. eGFR's persistently <90 mL/min signify possible Chronic Kidney Disease.   . Anion gap 07/04/2014 11  5 - 15 Final  . Glucose-Capillary 07/04/2014 88  70 - 99 mg/dL Final  .  Comment 1 07/04/2014 Documented in Chart   Final  . Comment 2 07/04/2014 Notify RN   Final  . Glucose-Capillary 07/04/2014 134* 70 - 99 mg/dL Final  . Comment 1 07/04/2014 Documented in Chart   Final  . Comment 2 07/04/2014 Notify RN   Final  . Glucose-Capillary 07/04/2014 41* 70 - 99 mg/dL Final  . Glucose-Capillary 07/04/2014 38* 70 - 99 mg/dL Final  . Comment 1 07/04/2014 Repeat Test   Final  . Glucose-Capillary 07/04/2014 29* 70 - 99 mg/dL Final  . Glucose-Capillary 07/04/2014 201* 70 - 99 mg/dL Final  . Glucose-Capillary 07/04/2014 143* 70 - 99 mg/dL Final  . Glucose-Capillary 07/05/2014 146* 70 - 99 mg/dL Final  . Glucose-Capillary 07/05/2014 134* 70 - 99 mg/dL Final  . Glucose-Capillary 07/05/2014 130* 70 - 99 mg/dL Final  Nursing Home on 05/29/2014  Component Date Value Ref Range Status  . Hemoglobin 01/24/2014 11.1* 12.0 - 16.0 g/dL Final  . HCT 01/24/2014 38  36 - 46 % Final  . Platelets 01/24/2014 259  150 - 399 K/L Final  . WBC  01/24/2014 4.9   Final  . Glucose 01/24/2014 219   Final  . BUN 01/24/2014 29* 4 - 21 mg/dL Final  . Creatinine 01/24/2014 1.6* 0.5 - 1.1 mg/dL Final  . Potassium 01/24/2014 4.9  3.4 - 5.3 mmol/L Final  . Sodium 01/24/2014 142  137 - 147 mmol/L Final  . Hgb A1c MFr Bld 04/05/2014 7.1* 4.0 - 6.0 % Final     Assessment/Plan   ICD-9-CM ICD-10-CM   1. Oral thrush 112.0 B37.0   2. Diabetes mellitus type II, controlled 250.00 E11.9    --Rx nystatin susp 6 mL po swish and swallow QID x 5 days  --continue all other medications as ordered   Daneen Volcy S. Perlie Gold  [facility] [address] [place], [place] [address] [phone] Office (Wednesdays and Fridays 8 AM - 5 PM) [phone] Cell (Monday-Friday 8 AM - 5 PM)

## 2014-08-12 ENCOUNTER — Non-Acute Institutional Stay (SKILLED_NURSING_FACILITY): Payer: Medicare Other | Admitting: Internal Medicine

## 2014-08-12 DIAGNOSIS — F0280 Dementia in other diseases classified elsewhere without behavioral disturbance: Secondary | ICD-10-CM

## 2014-08-12 DIAGNOSIS — E1129 Type 2 diabetes mellitus with other diabetic kidney complication: Secondary | ICD-10-CM

## 2014-08-12 DIAGNOSIS — N058 Unspecified nephritic syndrome with other morphologic changes: Secondary | ICD-10-CM

## 2014-08-12 DIAGNOSIS — N183 Chronic kidney disease, stage 3 unspecified: Secondary | ICD-10-CM

## 2014-08-12 DIAGNOSIS — G309 Alzheimer's disease, unspecified: Secondary | ICD-10-CM

## 2014-08-12 DIAGNOSIS — E785 Hyperlipidemia, unspecified: Secondary | ICD-10-CM

## 2014-08-12 DIAGNOSIS — I10 Essential (primary) hypertension: Secondary | ICD-10-CM

## 2014-08-12 NOTE — Progress Notes (Signed)
Patient ID: Vickie [name], female   DOB: 1913/05/11, 81 y.o.   MRN: [id number]    [place]     Place of Service: SNF (31)   Allergies  Allergen Reactions  . Beta Adrenergic Blockers     Because of first degree AV block    Chief Complaint  Patient presents with  . Medical Management of Chronic Issues    DM II, CKD, Alzheimer's dementia, hyperlipidemia, edema, gout, left heel wound, GERD    HPI:  81 yo female long term resident seen today for above. Her CBGs are no longer checked. She does not take any meds for her diabetes. Last A1c 7.1%. She has no concerns but has dementia and is a poor historian. No nursing issues. She has been stable overall. Currently receiving ST for dysphagia.  CODE STATUS: DNR; MOST form on chart    Medications: Patient's Medications  New Prescriptions   No medications on file  Previous Medications   ALLOPURINOL (ZYLOPRIM) 100 MG TABLET    Take 100 mg by mouth every other day.    AMLODIPINE (NORVASC) 5 MG TABLET    Take 1 tablet (5 mg total) by mouth daily.   BRIMONIDINE-TIMOLOL (COMBIGAN) 0.2-0.5 % OPHTHALMIC SOLUTION    Place 1 drop into both eyes at bedtime.   CALCITONIN, SALMON, (MIACALCIN/FORTICAL) 200 UNIT/ACT NASAL SPRAY    Place 1 spray into alternate nostrils daily.   CALCIUM-VITAMIN D (OSCAL WITH D) 500-200 MG-UNIT PER TABLET    Take 1 tablet by mouth 3 (three) times daily.   CARBOXYMETHYLCELLULOSE (REFRESH PLUS) 0.5 % SOLN    Place 1 drop into both eyes 4 (four) times daily.   DICLOFENAC SODIUM (VOLTAREN) 1 % GEL    Apply 2 g topically 4 (four) times daily.   MEMANTINE HCL ER (NAMENDA XR) 14 MG CP24    Take 1 tablet (14 mg total) by mouth daily.   MULTIPLE VITAMINS-IRON (MULTIVITAMINS WITH IRON) TABS TABLET    Take 1 tablet by mouth every morning.    NUTRITIONAL SUPPLEMENTS (TWOCAL HN) LIQD    Take 90 mLs by mouth 3 (three) times daily.   SCOPOLAMINE (TRANSDERM-SCOP) 1 MG/3DAYS    Place 1 patch onto the skin  every 3 (three) days.  Modified Medications   No medications on file  Discontinued Medications   No medications on file     Review of Systems Unable to obtain due to pt's mental status  Filed Vitals:   08/12/14 1119  BP: 138/61  Pulse: 70  Temp: 97.5 F (36.4 C)  SpO2: 94%   There is no weight on file to calculate BMI.  Physical Exam CONSTITUTIONAL: Looks frail in NAD. Awake and alert  HEENT: PERRLA. Oropharynx clear and without exudate. MMM NECK: Supple. Nontender. No palpable cervical or supraclavicular lymph nodes. No carotid bruit b/l.  CVS: Regular rate with 1/6 SEmurmur. No gallop or rub. LUNGS: reduced BS R>L base but CTA b/l no wheezing, rales or rhonchi. ABDOMEN: Bowel sounds present x 4. Soft, nontender, nondistended. No palpable mass or bruit EXTREMITIES: No edema b/l. Distal pulses palpable. No calf tenderness PSYCH: confused   Labs reviewed: Admission on 07/02/2014, Discharged on 07/05/2014  Component Date Value Ref Range Status  . Color, Urine 07/02/2014 YELLOW  YELLOW Final  . APPearance 07/02/2014 CLEAR  CLEAR Final  . Specific Gravity, Urine 07/02/2014 1.017  1.005 - 1.030 Final  . pH 07/02/2014 5.0  5.0 - 8.0 Final  . Glucose, UA 07/02/2014 NEGATIVE  NEGATIVE mg/dL Final  . Hgb urine dipstick 07/02/2014 NEGATIVE  NEGATIVE Final  . Bilirubin Urine 07/02/2014 SMALL* NEGATIVE Final  . Ketones, ur 07/02/2014 15* NEGATIVE mg/dL Final  . Protein, ur 07/02/2014 NEGATIVE  NEGATIVE mg/dL Final  . Urobilinogen, UA 07/02/2014 1.0  0.0 - 1.0 mg/dL Final  . Nitrite 07/02/2014 NEGATIVE  NEGATIVE Final  . Leukocytes, UA 07/02/2014 NEGATIVE  NEGATIVE Final   MICROSCOPIC NOT DONE ON URINES WITH NEGATIVE PROTEIN, BLOOD, LEUKOCYTES, NITRITE, OR GLUCOSE <1000 mg/dL.  Marland Kitchen Sodium 07/02/2014 159* 137 - 147 mEq/L Final  . Potassium 07/02/2014 4.5  3.7 - 5.3 mEq/L Final  . Chloride 07/02/2014 114* 96 - 112 mEq/L Final  . BUN 07/02/2014 77* 6 - 23 mg/dL Final  . Creatinine,  Ser 07/02/2014 2.90* 0.50 - 1.10 mg/dL Final  . Glucose, Bld 07/02/2014 196* 70 - 99 mg/dL Final  . Calcium, Ion 07/02/2014 1.35* 1.13 - 1.30 mmol/L Final  . TCO2 07/02/2014 28  0 - 100 mmol/L Final  . Hemoglobin 07/02/2014 14.6  12.0 - 15.0 g/dL Final  . HCT 07/02/2014 43.0  36.0 - 46.0 % Final  . WBC 07/02/2014 7.5  4.0 - 10.5 K/uL Final  . RBC 07/02/2014 5.00  3.87 - 5.11 MIL/uL Final  . Hemoglobin 07/02/2014 13.5  12.0 - 15.0 g/dL Final  . HCT 07/02/2014 42.9  36.0 - 46.0 % Final  . MCV 07/02/2014 85.8  78.0 - 100.0 fL Final  . MCH 07/02/2014 27.0  26.0 - 34.0 pg Final  . MCHC 07/02/2014 31.5  30.0 - 36.0 g/dL Final  . RDW 07/02/2014 15.6* 11.5 - 15.5 % Final  . Platelets 07/02/2014 186  150 - 400 K/uL Final  . Osmolality, Ur 07/03/2014 575  390 - 1090 mOsm/kg Final   Performed at [facility]  . Sodium, Ur 07/03/2014 24   Final   Performed at [facility]  . Sodium 07/03/2014 156* 137 - 147 mEq/L Final  . Potassium 07/03/2014 4.3  3.7 - 5.3 mEq/L Final  . Chloride 07/03/2014 114* 96 - 112 mEq/L Final  . CO2 07/03/2014 28  19 - 32 mEq/L Final  . Glucose, Bld 07/03/2014 241* 70 - 99 mg/dL Final  . BUN 07/03/2014 80* 6 - 23 mg/dL Final  . Creatinine, Ser 07/03/2014 2.44* 0.50 - 1.10 mg/dL Final  . Calcium 07/03/2014 9.8  8.4 - 10.5 mg/dL Final  . GFR calc non Af Amer 07/03/2014 15* >90 mL/min Final  . GFR calc Af Amer 07/03/2014 18* >90 mL/min Final   Comment: (NOTE) The eGFR has been calculated using the CKD EPI equation. This calculation has not been validated in all clinical situations. eGFR's persistently <90 mL/min signify possible Chronic Kidney Disease.   . Anion gap 07/03/2014 14  5 - 15 Final  . Creatinine, Urine 07/03/2014 125.0   Final   Comment: No reference range established. Performed at [facility]   . Urea Nitrogen, Ur 07/03/2014 1157   Final   Comment: (NOTE)   No established reference range for random urine. Performed at [facility]   . Cholesterol 07/03/2014 163  0 - 200 mg/dL Final  . Triglycerides 07/03/2014 150* <150 mg/dL Final  . HDL 07/03/2014 53  >39 mg/dL Final  . Total CHOL/HDL Ratio 07/03/2014 3.1   Final  . VLDL 07/03/2014 30  0 - 40 mg/dL Final  . LDL Cholesterol 07/03/2014 80  0 - 99 mg/dL Final   Comment:  Total Cholesterol/HDL:CHD Risk Coronary Heart Disease Risk Table                     Men   Women  1/2 Average Risk   3.4   3.3  Average Risk       5.0   4.4  2 X Average Risk   9.6   7.1  3 X Average Risk  23.4   11.0        Use the calculated Patient Ratio above and the CHD Risk Table to determine the patient's CHD Risk.        ATP III CLASSIFICATION (LDL):  <100     mg/dL   Optimal  100-129  mg/dL   Near or Above                    Optimal  130-159  mg/dL   Borderline  160-189  mg/dL   High  >190     mg/dL   Very High Performed at [facility]   . MRSA by PCR 07/03/2014 NEGATIVE  NEGATIVE Final   Comment:        The GeneXpert MRSA Assay (FDA approved for NASAL specimens only), is one component of a comprehensive MRSA colonization surveillance program. It is not intended to diagnose MRSA infection nor to guide or monitor treatment for MRSA infections.   . Sodium 07/03/2014 155* 137 - 147 mEq/L Final  . Potassium 07/03/2014 4.0  3.7 - 5.3 mEq/L Final  . Chloride 07/03/2014 115* 96 - 112 mEq/L Final  . CO2 07/03/2014 27  19 - 32 mEq/L Final  . Glucose, Bld 07/03/2014 228* 70 - 99 mg/dL Final  . BUN 07/03/2014 69* 6 - 23 mg/dL Final  . Creatinine, Ser 07/03/2014 2.19* 0.50 - 1.10 mg/dL Final  . Calcium 07/03/2014 9.3  8.4 - 10.5 mg/dL Final  . GFR calc non Af Amer 07/03/2014 17* >90 mL/min Final  . GFR calc Af Amer 07/03/2014 20* >90 mL/min Final   Comment: (NOTE) The eGFR has been calculated using the CKD EPI equation. This calculation has not been validated in all clinical situations. eGFR's persistently <90 mL/min signify possible Chronic  Kidney Disease.   . Anion gap 07/03/2014 13  5 - 15 Final  . Glucose-Capillary 07/03/2014 184* 70 - 99 mg/dL Final  . Comment 1 07/03/2014 Documented in Chart   Final  . Comment 2 07/03/2014 Notify RN   Final  . Glucose-Capillary 07/03/2014 177* 70 - 99 mg/dL Final  . Comment 1 07/03/2014 Documented in Chart   Final  . Comment 2 07/03/2014 Notify RN   Final  . Glucose-Capillary 07/03/2014 124* 70 - 99 mg/dL Final  . Comment 1 07/03/2014 Documented in Chart   Final  . Comment 2 07/03/2014 Notify RN   Final  . Sodium 07/04/2014 144  137 - 147 mEq/L Final   Comment: DELTA CHECK NOTED REPEATED TO VERIFY   . Potassium 07/04/2014 3.6* 3.7 - 5.3 mEq/L Final  . Chloride 07/04/2014 107  96 - 112 mEq/L Final  . CO2 07/04/2014 26  19 - 32 mEq/L Final  . Glucose, Bld 07/04/2014 128* 70 - 99 mg/dL Final  . BUN 07/04/2014 45* 6 - 23 mg/dL Final   Comment: DELTA CHECK NOTED REPEATED TO VERIFY   . Creatinine, Ser 07/04/2014 1.77* 0.50 - 1.10 mg/dL Final  . Calcium 07/04/2014 9.0  8.4 - 10.5 mg/dL Final  . GFR calc non Af Wyvonnia Lora 07/04/2014  23* >90 mL/min Final  . GFR calc Af Amer 07/04/2014 26* >90 mL/min Final   Comment: (NOTE) The eGFR has been calculated using the CKD EPI equation. This calculation has not been validated in all clinical situations. eGFR's persistently <90 mL/min signify possible Chronic Kidney Disease.   . Anion gap 07/04/2014 11  5 - 15 Final  . Glucose-Capillary 07/04/2014 88  70 - 99 mg/dL Final  . Comment 1 07/04/2014 Documented in Chart   Final  . Comment 2 07/04/2014 Notify RN   Final  . Glucose-Capillary 07/04/2014 134* 70 - 99 mg/dL Final  . Comment 1 07/04/2014 Documented in Chart   Final  . Comment 2 07/04/2014 Notify RN   Final  . Glucose-Capillary 07/04/2014 41* 70 - 99 mg/dL Final  . Glucose-Capillary 07/04/2014 38* 70 - 99 mg/dL Final  . Comment 1 07/04/2014 Repeat Test   Final  . Glucose-Capillary 07/04/2014 29* 70 - 99 mg/dL Final  . Glucose-Capillary  07/04/2014 201* 70 - 99 mg/dL Final  . Glucose-Capillary 07/04/2014 143* 70 - 99 mg/dL Final  . Glucose-Capillary 07/05/2014 146* 70 - 99 mg/dL Final  . Glucose-Capillary 07/05/2014 134* 70 - 99 mg/dL Final  . Glucose-Capillary 07/05/2014 130* 70 - 99 mg/dL Final  Nursing Home on 05/29/2014  Component Date Value Ref Range Status  . Hemoglobin 01/24/2014 11.1* 12.0 - 16.0 g/dL Final  . HCT 01/24/2014 38  36 - 46 % Final  . Platelets 01/24/2014 259  150 - 399 K/L Final  . WBC 01/24/2014 4.9   Final  . Glucose 01/24/2014 219   Final  . BUN 01/24/2014 29* 4 - 21 mg/dL Final  . Creatinine 01/24/2014 1.6* 0.5 - 1.1 mg/dL Final  . Potassium 01/24/2014 4.9  3.4 - 5.3 mmol/L Final  . Sodium 01/24/2014 142  137 - 147 mmol/L Final  . Hgb A1c MFr Bld 04/05/2014 7.1* 4.0 - 6.0 % Final   07/18/14 BMP: Na 139, K 4.6, BUN 22, Cr 1.37, Cl 103, HCO3 18, glucose 358  Assessment/Plan   ICD-9-CM ICD-10-CM   1. Alzheimer's disease- stable 331.0 G30.9   2. Essential hypertension, benign - stable 401.1 I10   3. Chronic renal disease, stage 3, moderately decreased glomerular filtration rate (GFR) between 30-59 mL/min/1.73 square meter - stable 585.3 N18.3   4. DM (diabetes mellitus) type II controlled with renal manifestation- borderline controlled 250.40 E11.29     N05.8   5. Hyperlipidemia with target LDL less than 100 - controlled 272.4 E78.5    --she is medically stable on current tx plan. Continue current medications as ordered. Continue ST for dysphagia. Continue nutritional supplement. Will follow    Chas Axel S. Perlie Gold  [facility] [address] [place], [place] [address] [phone] Office (Wednesdays and Fridays 8 AM - 5 PM) [phone] Cell (Monday-Friday 8 AM - 5 PM)

## 2014-08-14 ENCOUNTER — Encounter: Payer: Self-pay | Admitting: Adult Health

## 2014-08-19 ENCOUNTER — Encounter: Payer: Self-pay | Admitting: Internal Medicine

## 2014-09-05 NOTE — Progress Notes (Signed)
Patient ID: Vickie [name], female   DOB: 10-Nov-1912, 81 y.o.   MRN: [id number]   Review of Systems     Physical Exam   This encounter was created in error - please disregard.

## 2014-10-18 DEATH — deceased
